# Patient Record
Sex: Female | Born: 1995 | Race: White | Hispanic: No | Marital: Single | Smoking: Never smoker
Health system: Southern US, Community
[De-identification: ages and names within clinical notes are randomized; demographics above are authoritative.]

## PROBLEM LIST (undated history)

## (undated) DIAGNOSIS — R030 Elevated blood-pressure reading, without diagnosis of hypertension: Secondary | ICD-10-CM

## (undated) DIAGNOSIS — Z87828 Personal history of other (healed) physical injury and trauma: Secondary | ICD-10-CM

## (undated) DIAGNOSIS — Z8616 Personal history of COVID-19: Secondary | ICD-10-CM

## (undated) DIAGNOSIS — W3400XA Accidental discharge from unspecified firearms or gun, initial encounter: Secondary | ICD-10-CM

## (undated) DIAGNOSIS — N201 Calculus of ureter: Secondary | ICD-10-CM

## (undated) HISTORY — PX: ADENOIDECTOMY: SHX5191

## (undated) HISTORY — PX: TONSILLECTOMY: SUR1361

---
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
## 2017-09-04 DIAGNOSIS — H9203 Otalgia, bilateral: Secondary | ICD-10-CM | POA: Diagnosis not present

## 2017-09-04 DIAGNOSIS — R0683 Snoring: Secondary | ICD-10-CM | POA: Diagnosis not present

## 2017-09-04 DIAGNOSIS — Z3009 Encounter for other general counseling and advice on contraception: Secondary | ICD-10-CM | POA: Diagnosis not present

## 2017-10-28 DIAGNOSIS — R51 Headache: Secondary | ICD-10-CM | POA: Diagnosis not present

## 2017-10-28 DIAGNOSIS — H9203 Otalgia, bilateral: Secondary | ICD-10-CM | POA: Diagnosis not present
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
## 2020-08-15 ENCOUNTER — Encounter (HOSPITAL_COMMUNITY): Payer: Self-pay | Admitting: Emergency Medicine

## 2020-08-15 ENCOUNTER — Other Ambulatory Visit: Payer: Self-pay

## 2020-08-15 ENCOUNTER — Ambulatory Visit (HOSPITAL_COMMUNITY)
Admission: EM | Admit: 2020-08-15 | Discharge: 2020-08-15 | Disposition: A | Discharge status: Home / Self Care | Payer: 59 | Attending: Urgent Care | Admitting: Urgent Care

## 2020-08-15 DIAGNOSIS — R350 Frequency of micturition: Secondary | ICD-10-CM | POA: Diagnosis present

## 2020-08-15 DIAGNOSIS — R3915 Urgency of urination: Secondary | ICD-10-CM

## 2020-08-15 DIAGNOSIS — R109 Unspecified abdominal pain: Secondary | ICD-10-CM | POA: Diagnosis not present

## 2020-08-15 LAB — POCT URINALYSIS DIPSTICK, ED / UC
Bilirubin Urine: NEGATIVE
Glucose, UA: NEGATIVE mg/dL
Ketones, ur: NEGATIVE mg/dL
Leukocytes,Ua: NEGATIVE
Nitrite: NEGATIVE
Protein, ur: NEGATIVE mg/dL
Specific Gravity, Urine: 1.01 (ref 1.005–1.030)
Urobilinogen, UA: 0.2 mg/dL (ref 0.0–1.0)
pH: 7 (ref 5.0–8.0)

## 2020-08-15 LAB — POC URINE PREG, ED: Preg Test, Ur: NEGATIVE

## 2020-08-15 MED ORDER — NAPROXEN 375 MG PO TABS: 375.0000 mg | ORAL_TABLET | Freq: Two times a day (BID) | ORAL | 0 refills | Status: DC

## 2020-08-15 NOTE — Discharge Instructions (Addendum)

## 2020-08-15 NOTE — ED Notes (Signed)
Urine in lab 
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
## 2020-08-15 NOTE — ED Triage Notes (Signed)
Patient reports right flank pain, back pain this morning.  Noticed urgency to urinate yesterday.

## 2020-08-16 LAB — URINE CULTURE: Culture: <10000 — AB

## 2020-08-16 LAB — CERVICOVAGINAL ANCILLARY ONLY
Bacterial Vaginitis (gardnerella): POSITIVE — AB
Chlamydia: NEGATIVE
Comment: NEGATIVE
Comment: NEGATIVE
Comment: NEGATIVE
Comment: NORMAL
Neisseria Gonorrhea: NEGATIVE
Trichomonas: NEGATIVE

## 2020-08-18 ENCOUNTER — Telehealth (HOSPITAL_COMMUNITY): Payer: Self-pay | Admitting: Emergency Medicine

## 2020-08-18 MED ORDER — METRONIDAZOLE 500 MG PO TABS
500.0000 mg | ORAL_TABLET | Freq: Two times a day (BID) | ORAL | 0 refills | Status: DC
Start: 2020-08-18 — End: 2021-05-28

## 2021-04-17 ENCOUNTER — Emergency Department (HOSPITAL_COMMUNITY)
Admission: EM | Admit: 2021-04-17 | Discharge: 2021-04-17 | Disposition: A | Discharge status: Home / Self Care | Payer: 59 | Attending: Emergency Medicine | Admitting: Emergency Medicine

## 2021-04-17 ENCOUNTER — Ambulatory Visit (HOSPITAL_COMMUNITY): Admission: EM | Admit: 2021-04-17 | Discharge: 2021-04-17 | Discharge status: Against Medical Advice | Payer: 59

## 2021-04-17 ENCOUNTER — Emergency Department (HOSPITAL_COMMUNITY): Payer: 59

## 2021-04-17 ENCOUNTER — Encounter (HOSPITAL_COMMUNITY): Payer: Self-pay | Admitting: Emergency Medicine

## 2021-04-17 ENCOUNTER — Other Ambulatory Visit: Payer: Self-pay

## 2021-04-17 DIAGNOSIS — N132 Hydronephrosis with renal and ureteral calculous obstruction: Secondary | ICD-10-CM | POA: Diagnosis not present

## 2021-04-17 DIAGNOSIS — N9489 Other specified conditions associated with female genital organs and menstrual cycle: Secondary | ICD-10-CM | POA: Insufficient documentation

## 2021-04-17 DIAGNOSIS — R1031 Right lower quadrant pain: Secondary | ICD-10-CM | POA: Diagnosis present

## 2021-04-17 DIAGNOSIS — N200 Calculus of kidney: Secondary | ICD-10-CM

## 2021-04-17 LAB — URINALYSIS, ROUTINE W REFLEX MICROSCOPIC
Bilirubin Urine: NEGATIVE
Glucose, UA: NEGATIVE mg/dL
Ketones, ur: 20 mg/dL — AB
Nitrite: NEGATIVE
Protein, ur: 30 mg/dL — AB
RBC / HPF: >50 RBC/hpf — ABNORMAL HIGH (ref 0–5)
Specific Gravity, Urine: 1.023 (ref 1.005–1.030)
pH: 9 — ABNORMAL HIGH (ref 5.0–8.0)

## 2021-04-17 LAB — COMPREHENSIVE METABOLIC PANEL
ALT: 16 U/L (ref 0–44)
AST: 19 U/L (ref 15–41)
Albumin: 4.2 g/dL (ref 3.5–5.0)
Alkaline Phosphatase: 65 U/L (ref 38–126)
Anion gap: 12 (ref 5–15)
BUN: 11 mg/dL (ref 6–20)
CO2: 22 mmol/L (ref 22–32)
Calcium: 9.2 mg/dL (ref 8.9–10.3)
Chloride: 105 mmol/L (ref 98–111)
Creatinine, Ser: 1.09 mg/dL — ABNORMAL HIGH (ref 0.44–1.00)
GFR, Estimated: >60 mL/min (ref >60)
Glucose, Bld: 129 mg/dL — ABNORMAL HIGH (ref 70–99)
Potassium: 3 mmol/L — ABNORMAL LOW (ref 3.5–5.1)
Sodium: 139 mmol/L (ref 135–145)
Total Bilirubin: 0.6 mg/dL (ref 0.3–1.2)
Total Protein: 7.2 g/dL (ref 6.5–8.1)

## 2021-04-17 LAB — LIPASE, BLOOD: Lipase: 26 U/L (ref 11–51)

## 2021-04-17 LAB — I-STAT BETA HCG BLOOD, ED (MC, WL, AP ONLY): I-stat hCG, quantitative: <5 m[IU]/mL (ref <5)

## 2021-04-17 LAB — CBC
HCT: 37.5 % (ref 36.0–46.0)
Hemoglobin: 12.3 g/dL (ref 12.0–15.0)
MCH: 29.4 pg (ref 26.0–34.0)
MCHC: 32.8 g/dL (ref 30.0–36.0)
MCV: 89.7 fL (ref 80.0–100.0)
Platelets: 286 10*3/uL (ref 150–400)
RBC: 4.18 MIL/uL (ref 3.87–5.11)
RDW: 13.9 % (ref 11.5–15.5)
WBC: 10.4 10*3/uL (ref 4.0–10.5)
nRBC: 0 % (ref 0.0–0.2)

## 2021-04-17 MED ORDER — LACTATED RINGERS IV BOLUS
1000.0000 mL | Freq: Once | INTRAVENOUS | Status: AC
  Administered 2021-04-17: 1000 mL via INTRAVENOUS

## 2021-04-17 MED ORDER — KETOROLAC TROMETHAMINE 15 MG/ML IJ SOLN
15.0000 mg | Freq: Once | INTRAMUSCULAR | Status: AC
  Administered 2021-04-17: 15 mg via INTRAVENOUS
  Filled 2021-04-17: qty 1

## 2021-04-17 MED ORDER — ONDANSETRON HCL 4 MG/2ML IJ SOLN
4.0000 mg | Freq: Once | INTRAMUSCULAR | Status: AC
  Administered 2021-04-17: 4 mg via INTRAVENOUS
  Filled 2021-04-17: qty 2

## 2021-04-17 MED ORDER — HYDROCODONE-ACETAMINOPHEN 5-325 MG PO TABS: 1.0000 | ORAL_TABLET | Freq: Four times a day (QID) | ORAL | 0 refills | Status: DC | PRN

## 2021-04-17 MED ORDER — TAMSULOSIN HCL 0.4 MG PO CAPS: 0.4000 mg | ORAL_CAPSULE | Freq: Every day | ORAL | 0 refills | Status: DC

## 2021-04-17 MED ORDER — MORPHINE SULFATE (PF) 4 MG/ML IV SOLN
4.0000 mg | Freq: Once | INTRAVENOUS | Status: AC
  Administered 2021-04-17: 4 mg via INTRAVENOUS
  Filled 2021-04-17: qty 1

## 2021-04-17 NOTE — ED Provider Notes (Signed)
[facility] EMERGENCY DEPARTMENT Provider Note   CSN: [id number] Arrival date & time: 04/17/21  0957     History  Chief Complaint  Patient presents with   Abdominal Pain    Janice Fisher is a 26 y.o. female.  The history is provided by the patient.  Abdominal Pain Pain location:  RLQ Pain quality: sharp, shooting and stabbing   Pain radiates to:  Does not radiate Pain severity:  Severe Onset quality:  Sudden Duration:  5 hours Timing:  Constant Progression:  Unchanged Chronicity:  New Context: awakening from sleep   Relieved by:  Nothing Exacerbated by: standing up. Ineffective treatments:  None tried Associated symptoms: anorexia, nausea and vomiting   Associated symptoms: no diarrhea, no dysuria, no fever, no vaginal bleeding and no vaginal discharge   Risk factors comment:  Did report an abnormal period this month     Home Medications Prior to Admission medications   Medication Sig Start Date End Date Taking? Authorizing Provider  HYDROcodone-acetaminophen (NORCO/VICODIN) 5-325 MG tablet Take 1 tablet by mouth every 6 (six) hours as needed for severe pain. 04/17/21  Yes Gwyneth Sprout, MD  metroNIDAZOLE (FLAGYL) 500 MG tablet Take 1 tablet (500 mg total) by mouth 2 (two) times daily. 08/18/20   Merrilee Jansky, MD  naproxen (NAPROSYN) 375 MG tablet Take 1 tablet (375 mg total) by mouth 2 (two) times daily with a meal. 08/15/20   Wallis Bamberg, PA-C  tamsulosin (FLOMAX) 0.4 MG CAPS capsule Take 1 capsule (0.4 mg total) by mouth daily after supper. 04/17/21  Yes Gwyneth Sprout, MD      Allergies    Patient has no known allergies.    Review of Systems   Review of Systems  Constitutional:  Negative for fever.  Gastrointestinal:  Positive for abdominal pain, anorexia, nausea and vomiting. Negative for diarrhea.  Genitourinary:  Negative for dysuria, vaginal bleeding and vaginal discharge.   Physical Exam Updated Vital Signs BP (!) 122/91     Pulse 90    Temp 98.6 F (37 C) (Oral)    Resp 16    SpO2 100%  Physical Exam Vitals and nursing note reviewed.  Constitutional:      General: She is not in acute distress.    Appearance: She is well-developed.     Comments: Appears uncomfortable  HENT:     Head: Normocephalic and atraumatic.  Eyes:     Pupils: Pupils are equal, round, and reactive to light.  Cardiovascular:     Rate and Rhythm: Normal rate and regular rhythm.     Heart sounds: Normal heart sounds. No murmur heard.   No friction rub.  Pulmonary:     Effort: Pulmonary effort is normal.     Breath sounds: Normal breath sounds. No wheezing or rales.  Abdominal:     General: Bowel sounds are normal. There is no distension.     Palpations: Abdomen is soft.     Tenderness: There is abdominal tenderness in the right lower quadrant. There is guarding. There is no right CVA tenderness, left CVA tenderness or rebound. Negative signs include Murphy's sign.  Musculoskeletal:        General: No tenderness. Normal range of motion.     Comments: No edema  Skin:    General: Skin is warm and dry.     Findings: No rash.  Neurological:     Mental Status: She is alert and oriented to person, place, and time.  Cranial Nerves: No cranial nerve deficit.  Psychiatric:        Behavior: Behavior normal.    ED Results / Procedures / Treatments   Labs (all labs ordered are listed, but only abnormal results are displayed) Labs Reviewed  COMPREHENSIVE METABOLIC PANEL - Abnormal; Notable for the following components:      Result Value   Potassium 3.0 (*)    Glucose, Bld 129 (*)    Creatinine, Ser 1.09 (*)    All other components within normal limits  URINALYSIS, ROUTINE W REFLEX MICROSCOPIC - Abnormal; Notable for the following components:   APPearance CLOUDY (*)    pH 9.0 (*)    Hgb urine dipstick SMALL (*)    Ketones, ur 20 (*)    Protein, ur 30 (*)    Leukocytes,Ua MODERATE (*)    RBC / HPF >50 (*)    Bacteria, UA RARE  (*)    All other components within normal limits  LIPASE, BLOOD  CBC  I-STAT BETA HCG BLOOD, ED ([facility], [facility], [facility] ONLY)    EKG None  Radiology CT Renal Stone Study  Result Date: 04/17/2021 CLINICAL DATA:  Abdominal pain and emesis since 05/30 this a.m. EXAM: CT ABDOMEN AND PELVIS WITHOUT CONTRAST TECHNIQUE: Multidetector CT imaging of the abdomen and pelvis was performed following the standard protocol without IV contrast. RADIATION DOSE REDUCTION: This exam was performed according to the departmental dose-optimization program which includes automated exposure control, adjustment of the mA and/or kV according to patient size and/or use of iterative reconstruction technique. COMPARISON:  None. FINDINGS: Lower chest: Patchy ground-glass opacities in the right middle lobe for instance on images 7 & 15/5 are consistent with an infectious or inflammatory etiology. Hepatobiliary: Unremarkable noncontrast appearance of the hepatic parenchyma. Gallbladder appears normal. No biliary ductal dilation. Pancreas: No pancreatic ductal dilation or evidence of acute inflammation. Spleen: No splenomegaly or focal splenic lesion. Adrenals/Urinary Tract: Bilateral adrenal glands appear normal. Right perinephric and periureteric stranding with hydroureteronephrosis to the level of a 5 mm stone at the ureterovesicular junction on image 78/3. Additional punctate 2 mm right lower pole nonobstructive renal calculus. Left kidney is unremarkable without hydronephrosis or renal calculus. Stomach/Bowel: No enteric contrast was administered. Stomach is unremarkable for degree of distension. No pathologic dilation of small or large bowel. Terminal ileum and appendix (coronal image 38/6) appear normal. No evidence of acute bowel inflammation. Vascular/Lymphatic: Normal caliber abdominal aorta. No pathologically enlarged abdominal or pelvic lymph nodes. Reproductive: Uterus and left adnexa appear normal. 3.4 cm right ovarian cyst. Other:  Trace pelvic free fluid, within physiologic normal limits. Musculoskeletal: No acute or significant osseous findings. IMPRESSION: 1. 5 mm obstructing stone at the right ureterovesicular junction with associated right hydroureteronephrosis. 2. Additional punctate 2 mm right lower pole nonobstructive renal calculus. 3. Patchy ground-glass opacities in the right middle lobe are consistent with an infectious or inflammatory etiology. Electronically Signed   By: Maudry Mayhew M.D.   On: 04/17/2021 12:44    Procedures Procedures    Medications Ordered in ED Medications  ketorolac (TORADOL) 15 MG/ML injection 15 mg (has no administration in time range)  lactated ringers bolus 1,000 mL (1,000 mLs Intravenous New Bag/Given 04/17/21 1043)  morphine (PF) 4 MG/ML injection 4 mg (4 mg Intravenous Given 04/17/21 1045)  ondansetron (ZOFRAN) injection 4 mg (4 mg Intravenous Given 04/17/21 1044)    ED Course/ Medical Decision Making/ A&P  Medical Decision Making Amount and/or Complexity of Data Reviewed External Data Reviewed: notes. Labs: ordered. Decision-making details documented in ED Course. Radiology: ordered and independent interpretation performed. Decision-making details documented in ED Course. ECG/medicine tests: ordered and independent interpretation performed. Decision-making details documented in ED Course.  Risk Prescription drug management.   Healthy 26 year old female presenting today with symptoms of abdominal pain that started suddenly.  Concern for renal stone, appendicitis, ovarian pathology, ectopic pregnancy.  Patient denies any vaginal discharge or bleeding.  Moderate pain with palpation on exam.  She denies any urinary symptoms and lower suspicion for UTI.  Patient given pain and nausea control.  Labs and imaging are pending.  1:36 PM I independently interpreted patient's labs and today she has a normal CBC, CMP with hypokalemia of 3.0 but stable renal  function, UA is contaminated but does have a large amount of blood but only few white blood cells and bacteria and again it is a contaminated sample.  hCG is negative and lipase is within normal limits.  Patient did receive IV pain medication.  CT renal is pending  1:36 PM I independently reviewed and interpreted the abdominal and pelvis CT today and patient appears to have right-sided hydronephrosis and renal stone.  Radiology reports 5 mm obstructive right UVJ stone with hydroureteronephrosis.  Also they report a patchy groundglass opacity in the right middle lobe concerning for infectious or inflammatory etiology.  Speaking with patient she reports she had COVID about 1 month ago and she has had some ongoing coughing but reports things are improving.  Feel this is most likely aftereffects of COVID and does not need further work-up at this time.  After IV narcotic medication patient's pain has improved but she reports is starting to come back a little bit.  She was given Toradol.  Plan will be for discharge home as patient does not meet admission criteria at this time.  She was given pain control and Flomax to take at home.  Findings were discussed with she and her sister who are at bedside.  Questions were answered.  Patient is comfortable with this plan.        Final Clinical Impression(s) / ED Diagnoses Final diagnoses:  Right kidney stone    Rx / DC Orders ED Discharge Orders          Ordered    HYDROcodone-acetaminophen (NORCO/VICODIN) 5-325 MG tablet  Every 6 hours PRN        04/17/21 1335    tamsulosin (FLOMAX) 0.4 MG CAPS capsule  Daily after supper        04/17/21 1335              Gwyneth Sprout, MD 04/17/21 1336

## 2021-04-17 NOTE — Discharge Instructions (Addendum)
You have a large kidney stone on the right side.  If you start having fever, vomiting, cannot hold anything down or the pain is not controlled with the medications you can return to the emergency room.  In addition to the prescription pain medication if Tylenol and ibuprofen keep the pain under control you can always stick with those medications as well.  I expect that your stone should pass soon because it is very close to passing currently.

## 2021-04-17 NOTE — ED Notes (Signed)
Pt verbalizes understanding of discharge instructions. Opportunity for questions and answers were provided. Pt discharged from the ED.   ?

## 2021-04-17 NOTE — ED Triage Notes (Signed)
Patient complains of abdominal pain and emesis that started at 0530 this morning, reports abdominal pain last week that she thought was the beginning of her menstrual cycle but states she did not have any vaginal bleeding. Patient alert, oriented, and in no apparent distress at this time.

## 2021-04-25 ENCOUNTER — Other Ambulatory Visit: Payer: Self-pay

## 2021-04-25 ENCOUNTER — Emergency Department (HOSPITAL_BASED_OUTPATIENT_CLINIC_OR_DEPARTMENT_OTHER): Payer: 59

## 2021-04-25 ENCOUNTER — Other Ambulatory Visit (HOSPITAL_BASED_OUTPATIENT_CLINIC_OR_DEPARTMENT_OTHER): Payer: Self-pay

## 2021-04-25 ENCOUNTER — Emergency Department (HOSPITAL_BASED_OUTPATIENT_CLINIC_OR_DEPARTMENT_OTHER)
Admission: EM | Admit: 2021-04-25 | Discharge: 2021-04-25 | Disposition: A | Discharge status: Home / Self Care | Payer: 59 | Attending: Emergency Medicine | Admitting: Emergency Medicine

## 2021-04-25 DIAGNOSIS — N200 Calculus of kidney: Secondary | ICD-10-CM | POA: Diagnosis present

## 2021-04-25 DIAGNOSIS — N132 Hydronephrosis with renal and ureteral calculous obstruction: Secondary | ICD-10-CM | POA: Diagnosis not present

## 2021-04-25 DIAGNOSIS — N9489 Other specified conditions associated with female genital organs and menstrual cycle: Secondary | ICD-10-CM | POA: Insufficient documentation

## 2021-04-25 DIAGNOSIS — R109 Unspecified abdominal pain: Secondary | ICD-10-CM

## 2021-04-25 LAB — CBC
HCT: 37.7 % (ref 36.0–46.0)
Hemoglobin: 11.7 g/dL — ABNORMAL LOW (ref 12.0–15.0)
MCH: 27.9 pg (ref 26.0–34.0)
MCHC: 31 g/dL (ref 30.0–36.0)
MCV: 89.8 fL (ref 80.0–100.0)
Platelets: 269 10*3/uL (ref 150–400)
RBC: 4.2 MIL/uL (ref 3.87–5.11)
RDW: 14 % (ref 11.5–15.5)
WBC: 12.1 10*3/uL — ABNORMAL HIGH (ref 4.0–10.5)
nRBC: 0 % (ref 0.0–0.2)

## 2021-04-25 LAB — BASIC METABOLIC PANEL
Anion gap: 9 (ref 5–15)
BUN: 15 mg/dL (ref 6–20)
CO2: 24 mmol/L (ref 22–32)
Calcium: 9.5 mg/dL (ref 8.9–10.3)
Chloride: 106 mmol/L (ref 98–111)
Creatinine, Ser: 0.91 mg/dL (ref 0.44–1.00)
GFR, Estimated: >60 mL/min (ref >60)
Glucose, Bld: 89 mg/dL (ref 70–99)
Potassium: 3.6 mmol/L (ref 3.5–5.1)
Sodium: 139 mmol/L (ref 135–145)

## 2021-04-25 LAB — URINALYSIS, ROUTINE W REFLEX MICROSCOPIC
Bilirubin Urine: NEGATIVE
Glucose, UA: NEGATIVE mg/dL
Ketones, ur: NEGATIVE mg/dL
Leukocytes,Ua: NEGATIVE
Nitrite: NEGATIVE
Protein, ur: 30 mg/dL — AB
RBC / HPF: >50 RBC/hpf — ABNORMAL HIGH (ref 0–5)
Specific Gravity, Urine: 1.014 (ref 1.005–1.030)
pH: 7.5 (ref 5.0–8.0)

## 2021-04-25 LAB — HCG, SERUM, QUALITATIVE: Preg, Serum: NEGATIVE

## 2021-04-25 MED ORDER — OXYCODONE-ACETAMINOPHEN 5-325 MG PO TABS
1.0000 | ORAL_TABLET | Freq: Four times a day (QID) | ORAL | 0 refills | Status: DC | PRN
  Filled 2021-04-25: qty 15, 4d supply, fill #0

## 2021-04-25 MED ORDER — CEPHALEXIN 250 MG PO CAPS
250.0000 mg | ORAL_CAPSULE | Freq: Once | ORAL | Status: AC
  Administered 2021-04-25: 250 mg via ORAL
  Filled 2021-04-25: qty 1

## 2021-04-25 MED ORDER — CEPHALEXIN 500 MG PO CAPS
500.0000 mg | ORAL_CAPSULE | Freq: Four times a day (QID) | ORAL | 0 refills | Status: DC
  Filled 2021-04-25: qty 20, 5d supply, fill #0

## 2021-04-25 MED ORDER — SODIUM CHLORIDE 0.9 % IV BOLUS
1000.0000 mL | Freq: Once | INTRAVENOUS | Status: AC
  Administered 2021-04-25: 1000 mL via INTRAVENOUS

## 2021-04-25 MED ORDER — KETOROLAC TROMETHAMINE 15 MG/ML IJ SOLN
15.0000 mg | Freq: Once | INTRAMUSCULAR | Status: AC
  Administered 2021-04-25: 15 mg via INTRAVENOUS
  Filled 2021-04-25: qty 1

## 2021-04-25 MED ORDER — MORPHINE SULFATE (PF) 2 MG/ML IV SOLN
2.0000 mg | Freq: Once | INTRAVENOUS | Status: AC
  Administered 2021-04-25: 2 mg via INTRAVENOUS
  Filled 2021-04-25: qty 1

## 2021-04-25 NOTE — ED Provider Notes (Signed)
?[place] EMERGENCY DEPT ?Provider Note ? ? ?CSN: [id number] ?Arrival date & time: 04/25/21  1105 ? ?  ? ?History ? ?Chief Complaint  ?Patient presents with  ? Flank Pain  ? ? ?Janice Fisher is a 25 y.o. female. ? ?HPI ?26 yo female complaining of right flank pain with kidney stone.  She was diagnosed with 5 mm stone at the uvj on 2/21.  Pain was being controlled with otc meds.  Patient has apppointment with urology on Monday.  Pain worsened last night.  Increased frequecny of urination ?LMP during past 3 weeks.  Denies sexual activity currently-  ?No vomiting today. One episode of emesis yesterday. ? ? ?  ? ?Home Medications ?Prior to Admission medications   ?Medication Sig Start Date End Date Taking? Authorizing Provider  ?cephALEXin (KEFLEX) 500 MG capsule Take 1 capsule (500 mg total) by mouth 4 (four) times daily. 04/25/21  Yes Pattricia Boss, MD  ?oxyCODONE-acetaminophen (PERCOCET/ROXICET) 5-325 MG tablet Take 1 tablet by mouth every 6 (six) hours as needed for severe pain. 04/25/21  Yes Pattricia Boss, MD  ?HYDROcodone-acetaminophen (NORCO/VICODIN) 5-325 MG tablet Take 1 tablet by mouth every 6 (six) hours as needed for severe pain. 04/17/21   Blanchie Dessert, MD  ?metroNIDAZOLE (FLAGYL) 500 MG tablet Take 1 tablet (500 mg total) by mouth 2 (two) times daily. 08/18/20   Chase Picket, MD  ?naproxen (NAPROSYN) 375 MG tablet Take 1 tablet (375 mg total) by mouth 2 (two) times daily with a meal. 08/15/20   Jaynee Eagles, PA-C  ?tamsulosin (FLOMAX) 0.4 MG CAPS capsule Take 1 capsule (0.4 mg total) by mouth daily after supper. 04/17/21   Blanchie Dessert, MD  ?   ? ?Allergies    ?Patient has no known allergies.   ? ?Review of Systems   ?Review of Systems  ?All other systems reviewed and are negative. ? ?Physical Exam ?Updated Vital Signs ?BP 125/78   Pulse 83   Temp 98.4 ?F (36.9 ?C) (Oral)   Resp 15   Ht 1.524 m (5')   Wt 81.6 kg   LMP 04/18/2021   SpO2 100%   BMI 35.15 kg/m?  ?Physical  Exam ?Vitals and nursing note reviewed.  ?Constitutional:   ?   Appearance: Normal appearance.  ?HENT:  ?   Head: Normocephalic and atraumatic.  ?   Right Ear: External ear normal.  ?   Left Ear: External ear normal.  ?   Nose: Congestion present.  ?Cardiovascular:  ?   Rate and Rhythm: Normal rate and regular rhythm.  ?Pulmonary:  ?   Effort: Pulmonary effort is normal.  ?Abdominal:  ?   Comments: Mild right periumbilical pain  ?Skin: ?   General: Skin is warm.  ?   Capillary Refill: Capillary refill takes less than 2 seconds.  ?Neurological:  ?   General: No focal deficit present.  ?   Mental Status: She is alert.  ?Psychiatric:     ?   Mood and Affect: Mood normal.  ? ? ?ED Results / Procedures / Treatments   ?Labs ?(all labs ordered are listed, but only abnormal results are displayed) ?Labs Reviewed  ?URINALYSIS, ROUTINE W REFLEX MICROSCOPIC - Abnormal; Notable for the following components:  ?    Result Value  ? Hgb urine dipstick LARGE (*)   ? Protein, ur 30 (*)   ? RBC / HPF >50 (*)   ? Bacteria, UA RARE (*)   ? All other components within normal limits  ?CBC -  Abnormal; Notable for the following components:  ? WBC 12.1 (*)   ? Hemoglobin 11.7 (*)   ? All other components within normal limits  ?URINE CULTURE  ?BASIC METABOLIC PANEL  ?HCG, SERUM, QUALITATIVE  ? ? ?EKG ?None ? ?Radiology ?US RENAL ? ?Result Date: 04/25/2021 ?CLINICAL DATA:  Kidney stone follow-up. EXAM: RENAL / URINARY TRACT ULTRASOUND COMPLETE COMPARISON:  CT abdomen pelvis dated April 17, 2021. FINDINGS: Right Kidney: Renal measurements: 11.9 x 5.4 x 6.3 cm = volume: 214 mL. Echogenicity within normal limits. Unchanged mild hydronephrosis. No mass visualized. Left Kidney: Renal measurements: 12.2 x 5.5 x 5.0 cm = volume: 175 mL. Echogenicity within normal limits. No mass or hydronephrosis visualized. Bladder: Appears normal for degree of bladder distention. Other: None. IMPRESSION: 1. Unchanged mild right hydronephrosis. Electronically Signed    By: Titus Dubin M.D.   On: 04/25/2021 12:19   ? ?Procedures ?Procedures  ? ? ?Medications Ordered in ED ?Medications  ?sodium chloride 0.9 % bolus 1,000 mL (1,000 mLs Intravenous New Bag/Given 04/25/21 1141)  ?ketorolac (TORADOL) 15 MG/ML injection 15 mg (15 mg Intravenous Given 04/25/21 1142)  ?morphine (PF) 2 MG/ML injection 2 mg (2 mg Intravenous Given 04/25/21 1301)  ?cephALEXin (KEFLEX) capsule 250 mg (250 mg Oral Given 04/25/21 1326)  ? ? ?ED Course/ Medical Decision Making/ A&P ?Clinical Course as of 04/25/21 1403  ?Wed Apr 25, 2021  ?1240 Ultrasound reviewed and unchanged mild right hydro with nephrosis consistent with her previous lead diagnosis kidney stone ?Urinalysis reviewed and greater than 50 red blood cells with 0-5 white blood cells and rare bacteria, suspect that is contaminant, however given situation and ongoing flank pain and increased frequency of urination, will culture and start Keflex. [DR]  ?1241 CBC reviewed interpreted with mild leukocytosis and mild anemia with hemoglobin 11.7 ?Bement reviewed and interpreted with normal electrolytes, BUN/creatinine [DR]  ?1244 Patient reports continued severe flank pain.  No improvement with Toradol will give additional dose of morphine [DR]  ?  ?Clinical Course User Index ?[DR] Pattricia Boss, MD  ? ?                        ?Medical Decision Making ?26 year old female with previously diagnosed right UVJ stone presents today with ongoing pain.  Patient received Toradol and morphine here in the ED with improved pain.  She has an appointment to follow-up with urology.  She has a few bacteria in her urine.  Urine will be cultured and she will be started on Keflex. ?Discussed return precautions need for follow-up and patient voices understanding. ? ?Amount and/or Complexity of Data Reviewed ?Labs: ordered. ?Radiology: ordered. ? ?Risk ?Prescription drug management. ? ? ? ? ? ? ? ? ? ?Final Clinical Impression(s) / ED Diagnoses ?Final diagnoses:  ?Kidney stone   ?Right flank pain  ? ? ?Rx / DC Orders ?ED Discharge Orders   ? ?      Ordered  ?  oxyCODONE-acetaminophen (PERCOCET/ROXICET) 5-325 MG tablet  Every 6 hours PRN       ? 04/25/21 1402  ?  cephALEXin (KEFLEX) 500 MG capsule  4 times daily       ? 04/25/21 1403  ? ?  ?  ? ?  ? ? ?  ?Pattricia Boss, MD ?04/25/21 1403 ? ?

## 2021-04-25 NOTE — Discharge Instructions (Signed)
You continue to have obstruction of your right kidney.  You are being started on antibiotics. ?Please keep your appointment for follow-up with the urologist ?Please call them to see if they can get you in any sooner. ?

## 2021-04-25 NOTE — ED Triage Notes (Signed)
Right sided flank pain x 1 week. Dx with kidney stone last week. Pain worse today.  ?

## 2021-04-27 LAB — URINE CULTURE

## 2021-05-15 ENCOUNTER — Ambulatory Visit: Payer: 59 | Admitting: Allergy & Immunology

## 2021-05-15 ENCOUNTER — Other Ambulatory Visit: Payer: Self-pay | Admitting: Urology

## 2021-05-28 ENCOUNTER — Encounter (HOSPITAL_BASED_OUTPATIENT_CLINIC_OR_DEPARTMENT_OTHER): Payer: Self-pay | Admitting: Urology

## 2021-05-28 ENCOUNTER — Other Ambulatory Visit: Payer: Self-pay

## 2021-05-28 NOTE — Progress Notes (Signed)
Spoke w/ via phone for pre-op interview--- pt ?Lab needs dos----   urine preg            ?Lab results------ no ?COVID test -----patient states asymptomatic no test needed ?Arrive at ------- 1000 on 06-04-2021 ?NPO after MN NO Solid Food.  Clear liquids from MN until--- 0900 ?Med rec completed ?Medications to take morning of surgery ----- none ?Diabetic medication ----- n/a ?Patient instructed no nail polish to be worn day of surgery ?Patient instructed to bring photo id and insurance card day of surgery ?Patient aware to have Driver (ride ) / caregiver  for 24 hours after surgery --father, antoine ?Patient Special Instructions ----- n/a ?Pre-Op special Istructions ----- n/a ?Patient verbalized understanding of instructions that were given at this phone interview. ?Patient denies shortness of breath, chest pain, fever, cough at this phone interview.  ?

## 2021-06-04 ENCOUNTER — Ambulatory Visit (HOSPITAL_BASED_OUTPATIENT_CLINIC_OR_DEPARTMENT_OTHER): Payer: 59 | Admitting: Anesthesiology

## 2021-06-04 ENCOUNTER — Encounter (HOSPITAL_BASED_OUTPATIENT_CLINIC_OR_DEPARTMENT_OTHER)
Admission: RE | Disposition: A | Discharge status: Home / Self Care | Payer: Self-pay | Source: Home / Self Care | Attending: Urology

## 2021-06-04 ENCOUNTER — Encounter (HOSPITAL_BASED_OUTPATIENT_CLINIC_OR_DEPARTMENT_OTHER): Payer: Self-pay | Admitting: Urology

## 2021-06-04 ENCOUNTER — Ambulatory Visit (HOSPITAL_BASED_OUTPATIENT_CLINIC_OR_DEPARTMENT_OTHER)
Admission: RE | Admit: 2021-06-04 | Discharge: 2021-06-04 | Disposition: A | Discharge status: Home / Self Care | Payer: 59 | Attending: Urology | Admitting: Urology

## 2021-06-04 DIAGNOSIS — N132 Hydronephrosis with renal and ureteral calculous obstruction: Secondary | ICD-10-CM | POA: Insufficient documentation

## 2021-06-04 DIAGNOSIS — N202 Calculus of kidney with calculus of ureter: Secondary | ICD-10-CM

## 2021-06-04 DIAGNOSIS — N200 Calculus of kidney: Secondary | ICD-10-CM

## 2021-06-04 HISTORY — DX: Calculus of ureter: N20.1

## 2021-06-04 HISTORY — DX: Elevated blood-pressure reading, without diagnosis of hypertension: R03.0

## 2021-06-04 HISTORY — PX: CYSTOSCOPY/URETEROSCOPY/HOLMIUM LASER/STENT PLACEMENT: SHX6546

## 2021-06-04 HISTORY — DX: Personal history of COVID-19: Z86.16

## 2021-06-04 HISTORY — DX: Personal history of other (healed) physical injury and trauma: Z87.828

## 2021-06-04 LAB — POCT PREGNANCY, URINE: Preg Test, Ur: NEGATIVE

## 2021-06-04 SURGERY — CYSTOSCOPY/URETEROSCOPY/HOLMIUM LASER/STENT PLACEMENT
Anesthesia: General | Site: Ureter | Laterality: Right

## 2021-06-04 MED ORDER — DEXAMETHASONE SODIUM PHOSPHATE 4 MG/ML IJ SOLN
INTRAMUSCULAR | Status: DC | PRN
  Administered 2021-06-04: 10 mg via INTRAVENOUS

## 2021-06-04 MED ORDER — PROPOFOL 10 MG/ML IV BOLUS
INTRAVENOUS | Status: DC | PRN
  Administered 2021-06-04: 200 mg via INTRAVENOUS

## 2021-06-04 MED ORDER — DOCUSATE SODIUM 100 MG PO CAPS: 100.0000 mg | ORAL_CAPSULE | Freq: Every day | ORAL | 0 refills | Status: DC | PRN

## 2021-06-04 MED ORDER — KETOROLAC TROMETHAMINE 30 MG/ML IJ SOLN
INTRAMUSCULAR | Status: DC | PRN
  Administered 2021-06-04: 30 mg via INTRAVENOUS

## 2021-06-04 MED ORDER — MIDAZOLAM HCL 2 MG/2ML IJ SOLN
INTRAMUSCULAR | Status: AC
  Filled 2021-06-04: qty 2

## 2021-06-04 MED ORDER — IOHEXOL 300 MG/ML  SOLN
INTRAMUSCULAR | Status: DC | PRN
  Administered 2021-06-04: 10 mL via URETHRAL

## 2021-06-04 MED ORDER — SODIUM CHLORIDE 0.9 % IR SOLN
Status: DC | PRN
  Administered 2021-06-04: 3000 mL

## 2021-06-04 MED ORDER — CEFAZOLIN SODIUM-DEXTROSE 2-4 GM/100ML-% IV SOLN
2.0000 g | Freq: Once | INTRAVENOUS | Status: AC
  Administered 2021-06-04: 2 g via INTRAVENOUS

## 2021-06-04 MED ORDER — FENTANYL CITRATE (PF) 100 MCG/2ML IJ SOLN
25.0000 ug | INTRAMUSCULAR | Status: DC | PRN
  Administered 2021-06-04 (×3): 25 ug via INTRAVENOUS

## 2021-06-04 MED ORDER — ACETAMINOPHEN 500 MG PO TABS
1000.0000 mg | ORAL_TABLET | Freq: Once | ORAL | Status: AC
  Administered 2021-06-04: 1000 mg via ORAL

## 2021-06-04 MED ORDER — 0.9 % SODIUM CHLORIDE (POUR BTL) OPTIME
TOPICAL | Status: DC | PRN
  Administered 2021-06-04: 500 mL

## 2021-06-04 MED ORDER — ONDANSETRON HCL 4 MG/2ML IJ SOLN
INTRAMUSCULAR | Status: DC | PRN
  Administered 2021-06-04: 4 mg via INTRAVENOUS

## 2021-06-04 MED ORDER — LACTATED RINGERS IV SOLN: INTRAVENOUS | Status: DC

## 2021-06-04 MED ORDER — MIDAZOLAM HCL 5 MG/5ML IJ SOLN
INTRAMUSCULAR | Status: DC | PRN
  Administered 2021-06-04: 2 mg via INTRAVENOUS

## 2021-06-04 MED ORDER — PROPOFOL 10 MG/ML IV BOLUS
INTRAVENOUS | Status: AC
  Filled 2021-06-04: qty 20

## 2021-06-04 MED ORDER — OXYCODONE-ACETAMINOPHEN 5-325 MG PO TABS: 1.0000 | ORAL_TABLET | ORAL | 0 refills | Status: DC | PRN

## 2021-06-04 MED ORDER — CEFAZOLIN SODIUM-DEXTROSE 2-4 GM/100ML-% IV SOLN
INTRAVENOUS | Status: AC
  Filled 2021-06-04: qty 100

## 2021-06-04 MED ORDER — FENTANYL CITRATE (PF) 100 MCG/2ML IJ SOLN
INTRAMUSCULAR | Status: DC | PRN
  Administered 2021-06-04 (×4): 25 ug via INTRAVENOUS

## 2021-06-04 MED ORDER — FENTANYL CITRATE (PF) 100 MCG/2ML IJ SOLN
INTRAMUSCULAR | Status: AC
  Filled 2021-06-04: qty 2

## 2021-06-04 MED ORDER — LIDOCAINE HCL (CARDIAC) PF 100 MG/5ML IV SOSY
PREFILLED_SYRINGE | INTRAVENOUS | Status: DC | PRN
  Administered 2021-06-04: 60 mg via INTRAVENOUS

## 2021-06-04 MED ORDER — FENTANYL CITRATE (PF) 100 MCG/2ML IJ SOLN
INTRAMUSCULAR | Status: AC
Start: 2021-06-04 — End: ?
  Filled 2021-06-04: qty 2

## 2021-06-04 MED ORDER — DEXAMETHASONE SODIUM PHOSPHATE 10 MG/ML IJ SOLN
INTRAMUSCULAR | Status: AC
  Filled 2021-06-04: qty 1

## 2021-06-04 MED ORDER — ACETAMINOPHEN 500 MG PO TABS
ORAL_TABLET | ORAL | Status: AC
  Filled 2021-06-04: qty 2

## 2021-06-04 MED ORDER — LIDOCAINE HCL (PF) 2 % IJ SOLN
INTRAMUSCULAR | Status: AC
  Filled 2021-06-04: qty 5

## 2021-06-04 MED ORDER — KETOROLAC TROMETHAMINE 30 MG/ML IJ SOLN
INTRAMUSCULAR | Status: AC
  Filled 2021-06-04: qty 1

## 2021-06-04 MED ORDER — CEPHALEXIN 500 MG PO CAPS
500.0000 mg | ORAL_CAPSULE | Freq: Two times a day (BID) | ORAL | 0 refills | Status: AC
Start: 2021-06-04 — End: 2021-06-07

## 2021-06-04 MED ORDER — ONDANSETRON HCL 4 MG/2ML IJ SOLN
INTRAMUSCULAR | Status: AC
  Filled 2021-06-04: qty 2

## 2021-06-04 SURGICAL SUPPLY — 27 items
APL SKNCLS STERI-STRIP NONHPOA (GAUZE/BANDAGES/DRESSINGS) ×1
BAG DRAIN URO-CYSTO SKYTR STRL (DRAIN) ×2 IMPLANT
BAG DRN UROCATH (DRAIN) ×1
BASKET ZERO TIP NITINOL 2.4FR (BASKET) ×1 IMPLANT
BENZOIN TINCTURE PRP APPL 2/3 (GAUZE/BANDAGES/DRESSINGS) ×1 IMPLANT
BSKT STON RTRVL ZERO TP 2.4FR (BASKET) ×1
CATH URET 5FR 28IN OPEN ENDED (CATHETERS) ×2 IMPLANT
CLOTH BEACON ORANGE TIMEOUT ST (SAFETY) ×2 IMPLANT
COVER DOME SNAP 22 D (MISCELLANEOUS) ×1 IMPLANT
DRSG TEGADERM 2-3/8X2-3/4 SM (GAUZE/BANDAGES/DRESSINGS) ×1 IMPLANT
GLOVE BIO SURGEON STRL SZ7 (GLOVE) ×2 IMPLANT
GLOVE SURG UNDER POLY LF SZ6.5 (GLOVE) ×2 IMPLANT
GOWN STRL REUS W/ TWL LRG LVL3 (GOWN DISPOSABLE) IMPLANT
GOWN STRL REUS W/TWL LRG LVL3 (GOWN DISPOSABLE) ×4 IMPLANT
GUIDEWIRE STR DUAL SENSOR (WIRE) ×2 IMPLANT
GUIDEWIRE ZIPWRE .038 STRAIGHT (WIRE) ×1 IMPLANT
IV NS IRRIG 3000ML ARTHROMATIC (IV SOLUTION) ×2 IMPLANT
KIT TURNOVER CYSTO (KITS) ×2 IMPLANT
MANIFOLD NEPTUNE II (INSTRUMENTS) ×2 IMPLANT
NS IRRIG 500ML POUR BTL (IV SOLUTION) ×2 IMPLANT
PACK CYSTO (CUSTOM PROCEDURE TRAY) ×2 IMPLANT
STENT URET 6FRX24 CONTOUR (STENTS) ×1 IMPLANT
SYR 10ML LL (SYRINGE) ×2 IMPLANT
TRACTIP FLEXIVA PULS ID 200XHI (Laser) IMPLANT
TRACTIP FLEXIVA PULSE ID 200 (Laser) ×2
TUBE CONNECTING 12X1/4 (SUCTIONS) ×2 IMPLANT
TUBING UROLOGY SET (TUBING) ×2 IMPLANT

## 2021-06-04 NOTE — Op Note (Signed)
Operative Note ? ?Preoperative diagnosis:  ?1.  Right ureteral and renal stone ? ?Postoperative diagnosis: ?1.  Right ureteral and renal stone ? ?Procedure(s): ?1.  Cystoscopy ?2. Right ureteroscopy with laser lithotripsy and basket extraction of stones ?3. Right retrograde pyelogram ?4. Right ureteral stent placement ?5. Fluoroscopy with intraoperative interpretation ? ?Surgeon: Jettie Pagan, MD ? ?Assistants:  None ? ?Anesthesia:  General ? ?Complications:  None ? ?EBL:  Minimal ? ?Specimens: ?1. None ? ?Drains/Catheters: ?1.  6Fr x 24cm ureteral stent WITH a tether string ? ?Intraoperative findings:   ?Cystoscopy demonstrated no suspicious bladder lesions. ?Right ureteroscopy demonstrated no stones within her right ureter. She did have a 45mm stone in the lower pole of the right kidney that was dusted into sand pieces and too small to basket extract. ?Successful right ureteral stent placement. ? ?Indication:  Janice Fisher is a 26 y.o. Fisher with a history of a right ureteral and renal stone here for definitive stone treatment. ? ?Description of procedure: ?After informed consent was obtained from the patient, the patient was identified and taken to the operating room and placed in the supine position.  General anesthesia was administered as well as perioperative IV antibiotics.  At the beginning of the case, a time-out was performed to properly identify the patient, the surgery to be performed, and the surgical site.  Sequential compression devices were applied to the lower extremities at the beginning of the case for DVT prophylaxis.  The patient was then placed in the dorsal lithotomy supine position, prepped and draped in sterile fashion. ? ?We then passed the 21-French rigid cystoscope through the urethra and into the bladder under vision without any difficulty, noting a normal urethra.  A systematic evaluation of the bladder revealed no evidence of any suspicious bladder lesions.  Ureteral orifices were in  normal position.   ? ?Under cystoscopic and flouroscopic guidance, we cannulated the right ureteral orifice with a 5-French open-ended ureteral catheter and a gentle retrograde pyelogram was performed, revealing a normal caliber ureter without any filling defects. There was no hydronephrosis of the collecting system. The ureteral catheter and cystoscope were removed, leaving the safety wire in place.  ? ?A semi-rigid ureteroscope was passed alongside the wire up the distal ureter which appeared normal. A second 0.038 sensor wire was passed under direct vision. Over this wire, a flexible ureteroscope was passed into the kidney under fluoroscopic guidance. The collecting system was inspected. The calculus was identified at the right lower pole measuring about 38mm. Using the 200 micron holmium laser fiber, the stone was dusted completely. The fragments were too small to basket extract. With the ureteroscope in the kidney, a gentle pyelogram was performed to delineate the calyceal system and we evaluated the calyces systematically. We encountered no further stones. The rest of the stone fragments were very tiny and these were  irrigated away gently. The calyces were re-inspected and there were no significant stone fragment residual.  ? ?We then withdrew the ureteroscope back down the ureter, noting no evidence of any stones along the course of the ureter.  Prior to removing the ureteroscope, we did pass the Glidewire back up to the ureter to the renal pelvis.  Once the ureteroscope was removed, we then used the Glidewire under fluoroscopic guidance and passed up a 6-French x 24 cm double-pigtail ureteral stent up the ureter, making sure that the proximal and distal ends coiled within the kidney and bladder respectively. ? ?Note that we left a long tether string  attached to the distal end of the ureteral stent and it exited the urethral meatus and was secured to the inner thigh with a tegaderm adhesive.  The cystoscope  was then advanced back into the bladder under vision.  We were able to see the distal stent coiling nicely within the bladder.  The bladder was then emptied with irrigation solution.  The cystoscope was then removed.   ? ?The patient tolerated the procedure well and there was no complication. Patient was awoken from anesthesia and taken to the recovery room in stable condition. I was present and scrubbed for the entirety of the case. ? ?Plan:  Patient will be discharged home.  Follow up with me in one month with renal ultrasound. ? ?Janice R. Legacie Dillingham MD ?[facility]  ?Pager: [phone] ? ?

## 2021-06-04 NOTE — Anesthesia Procedure Notes (Signed)
Procedure Name: LMA Insertion ?Date/Time: 06/04/2021 12:21 PM ?Performed by: Justice Rocher, CRNA ?Pre-anesthesia Checklist: Patient identified, Emergency Drugs available, Suction available, Patient being monitored and Timeout performed ?Patient Re-evaluated:Patient Re-evaluated prior to induction ?Oxygen Delivery Method: Circle system utilized ?Preoxygenation: Pre-oxygenation with 100% oxygen ?Induction Type: IV induction ?Ventilation: Mask ventilation without difficulty ?LMA: LMA inserted ?LMA Size: 4.0 ?Number of attempts: 1 ?Airway Equipment and Method: Bite block ?Placement Confirmation: positive ETCO2, breath sounds checked- equal and bilateral and CO2 detector ?Tube secured with: Tape ?Dental Injury: Teeth and Oropharynx as per pre-operative assessment  ? ? ? ? ?

## 2021-06-04 NOTE — Transfer of Care (Signed)
Immediate Anesthesia Transfer of Care Note ? ?Patient: Janice Fisher ? ?Procedure(s) Performed: Procedure(s) (LRB): ?CYSTOSCOPY/ RETROGRADE/URETEROSCOPY/HOLMIUM LASER/STENT PLACEMENT (Right) ? ?Patient Location: PACU ? ?Anesthesia Type: General ? ?Level of Consciousness: awake, sedated, patient cooperative and responds to stimulation ? ?Airway & Oxygen Therapy: Patient Spontanous Breathing and Patient connected to [place] 02  ? ?Post-op Assessment: Report given to PACU RN, Post -op Vital signs reviewed and stable and Patient moving all extremities ? ?Post vital signs: Reviewed and stable ? ?Complications: No apparent anesthesia complications ?

## 2021-06-04 NOTE — Anesthesia Preprocedure Evaluation (Addendum)
Anesthesia Evaluation  ?Patient identified by MRN, date of birth, ID band ?Patient awake ? ? ? ?Reviewed: ?Allergy & Precautions, NPO status , Patient's Chart, lab work & pertinent test results ? ?Airway ?Mallampati: III ? ?TM Distance: >3 FB ?Neck ROM: Full ? ? ? Dental ?no notable dental hx. ?(+) Teeth Intact, Dental Advisory Given ?  ?Pulmonary ?neg pulmonary ROS,  ?  ?Pulmonary exam normal ?breath sounds clear to auscultation ? ? ? ? ? ? Cardiovascular ?negative cardio ROS ?Normal cardiovascular exam ?Rhythm:Regular Rate:Normal ? ? ?  ?Neuro/Psych ?negative neurological ROS ? negative psych ROS  ? GI/Hepatic ?negative GI ROS, Neg liver ROS,   ?Endo/Other  ?negative endocrine ROSObese BMI 36 ? Renal/GU ?negative Renal ROS  ?negative genitourinary ?  ?Musculoskeletal ?negative musculoskeletal ROS ?(+)  ? Abdominal ?  ?Peds ? Hematology ?negative hematology ROS ?(+)   ?Anesthesia Other Findings ?Right ureteral stone ? Reproductive/Obstetrics ? ?  ? ? ? ? ? ? ? ? ? ? ? ? ? ?  ?  ? ? ? ? ? ? ? ?Anesthesia Physical ?Anesthesia Plan ? ?ASA: 2 ? ?Anesthesia Plan: General  ? ?Post-op Pain Management: Tylenol PO (pre-op)* and Toradol IV (intra-op)*  ? ?Induction: Intravenous ? ?PONV Risk Score and Plan: 3 and Ondansetron, Dexamethasone and Midazolam ? ?Airway Management Planned: LMA ? ?Additional Equipment:  ? ?Intra-op Plan:  ? ?Post-operative Plan: Extubation in OR ? ?Informed Consent: I have reviewed the patients History and Physical, chart, labs and discussed the procedure including the risks, benefits and alternatives for the proposed anesthesia with the patient or authorized representative who has indicated his/her understanding and acceptance.  ? ? ? ?Dental advisory given ? ?Plan Discussed with: CRNA ? ?Anesthesia Plan Comments:   ? ? ? ? ? ? ?Anesthesia Quick Evaluation ? ?

## 2021-06-04 NOTE — H&P (Signed)
Office Visit Report     04/30/2021  ? ?-------------------------------------------------------------------------------- ?  ?Raynelle Bring  ?MRN: [id number]  ?DOB: 1995/12/16, 26 year old Female  ?SSN:   ? PRIMARY CARE:    ?REFERRING:  Blanchie Dessert, MD  ?PROVIDER:  Rexene Alberts, M.D.  ?LOCATION:  [facility] [phone]  ?  ? ?-------------------------------------------------------------------------------- ?  ?CC/HPI: Laurenda Stepler is a 26 year old female seen in consultation today for a right ureteral stone.  ? ?#1. Right ureteral stone:  ?-She presented to the ED in 03/2021 with acute onset right flank pain. CT A/P 04/17/2021 demonstrated 5 mm right UVJ stone with mild right hydronephrosis. Her pain was well controlled. She denies fevers or chills. She denies dysuria. She has had some intermittent flank pain and return to the ED on 04/25/2021. Renal ultrasound that time demonstrated persistent mild right hydroureteronephrosis. Her pain is presently well controlled today without sign of infection.  ?-This is her first stone episode. She denies a history of prior episodes.  ? ?Patient currently denies fever, chills, sweats, nausea, vomiting, abdominal or flank pain, gross hematuria or dysuria.  ? ?  ?ALLERGIES: No Known Drug Allergies ?  ? ?MEDICATIONS: None  ? ?GU PSH: None  ? ?NON-GU PSH: Tonsillectomy ? ?  ? ?GU PMH: None  ? ?NON-GU PMH: Anxiety ?  ? ?FAMILY HISTORY: No Family History   ? ?SOCIAL HISTORY: Marital Status: Single ?Ethnicity: Not Hispanic Or Latino; Race: Black or African American ?Current Smoking Status: Patient has never smoked.  ? ?Tobacco Use Assessment Completed: Used Tobacco in last 30 days? ?Does drink.  ?Drinks 2 caffeinated drinks per day. ?  ? ?REVIEW OF SYSTEMS:    ?GU Review Female:   Patient reports frequent urination, hard to postpone urination, get up at night to urinate, and stream starts and stops. Patient denies burning /pain with urination, leakage of urine,  trouble starting your stream, have to strain to urinate, and being pregnant.  ?Gastrointestinal (Upper):   Patient reports nausea and vomiting. Patient denies indigestion/ heartburn.  ?Gastrointestinal (Lower):   Patient reports constipation. Patient denies diarrhea.  ?Constitutional:   Patient reports fatigue. Patient denies fever, night sweats, and weight loss.  ?Skin:   Patient denies skin rash/ lesion and itching.  ?Eyes:   Patient denies blurred vision and double vision.  ?Ears/ Nose/ Throat:   Patient reports sore throat and sinus problems.   ?Hematologic/Lymphatic:   Patient reports easy bruising. Patient denies swollen glands.  ?Cardiovascular:   Patient denies leg swelling and chest pains.  ?Respiratory:   Patient denies cough and shortness of breath.  ?Endocrine:   Patient reports excessive thirst.   ?Musculoskeletal:   Patient reports back pain. Patient denies joint pain.  ?Neurological:   Patient reports headaches. Patient denies dizziness.  ?Psychologic:   Patient reports anxiety. Patient denies depression.  ? ?VITAL SIGNS:    ?  04/30/2021 08:35 AM  ?Weight 175 lb / 79.38 kg  ?Height 60 in / 152.4 cm  ?BP 134/100 mmHg  ?Pulse 69 /min  ?Temperature 97.7 F / 36.5 C  ?BMI 34.2 kg/m?  ? ?MULTI-SYSTEM PHYSICAL EXAMINATION:    ?Constitutional: Well-nourished. No physical deformities. Normally developed. Good grooming.  ?Respiratory: No labored breathing, no use of accessory muscles.   ?Cardiovascular: Normal temperature, normal extremity pulses, no swelling, no varicosities.  ?Gastrointestinal: No mass, no tenderness, no rigidity, non obese abdomen.  ? ?  ?Complexity of Data:  ?Source Of History:  Patient, Medical Record Summary  ?Records  Review:   Previous Doctor Records  ?Urine Test Review:   Urinalysis  ?X-Ray Review: C.T. Abdomen/Pelvis: Reviewed Films. Reviewed Report. Discussed With Patient.  ?  ?Notes:                     CLINICAL DATA: Abdominal pain and emesis since 05/30 this a.m..  ? ?EXAM:  ?CT  ABDOMEN AND PELVIS WITHOUT CONTRAST  ? ?TECHNIQUE:  ?Multidetector CT imaging of the abdomen and pelvis was performed  ?following the standard protocol without IV contrast.  ? ?RADIATION DOSE REDUCTION: This exam was performed according to the  ?departmental dose-optimization program which includes automated  ?exposure control, adjustment of the mA and/or kV according to  ?patient size and/or use of iterative reconstruction technique.  ? ?COMPARISON: None.  ? ?FINDINGS:  ?Lower chest: Patchy ground-glass opacities in the right middle lobe  ?for instance on images 7 & 15/5 are consistent with an infectious  ?or inflammatory etiology.  ? ?Hepatobiliary: Unremarkable noncontrast appearance of the hepatic  ?parenchyma. Gallbladder appears normal. No biliary ductal dilation.  ? ?Pancreas: No pancreatic ductal dilation or evidence of acute  ?inflammation.  ? ?Spleen: No splenomegaly or focal splenic lesion.  ? ?Adrenals/Urinary Tract: Bilateral adrenal glands appear normal.  ? ?Right perinephric and periureteric stranding with  ?hydroureteronephrosis to the level of a 5 mm stone at the  ?ureterovesicular junction on image 78/3.  ? ?Additional punctate 2 mm right lower pole nonobstructive renal  ?calculus.  ? ?Left kidney is unremarkable without hydronephrosis or renal  ?calculus.  ? ?Stomach/Bowel: No enteric contrast was administered. Stomach is  ?unremarkable for degree of distension. No pathologic dilation of  ?small or large bowel. Terminal ileum and appendix (coronal image  ?38/6) appear normal. No evidence of acute bowel inflammation.  ? ?Vascular/Lymphatic: Normal caliber abdominal aorta. No  ?pathologically enlarged abdominal or pelvic lymph nodes.  ? ?Reproductive: Uterus and left adnexa appear normal. 3.4 cm right  ?ovarian cyst.  ? ?Other: Trace pelvic free fluid, within physiologic normal limits.  ? ?Musculoskeletal: No acute or significant osseous findings.  ? ?IMPRESSION:  ?1. 5 mm obstructing stone at the  right ureterovesicular junction  ?with associated right hydroureteronephrosis.  ?2. Additional punctate 2 mm right lower pole nonobstructive renal  ?calculus.  ?3. Patchy ground-glass opacities in the right middle lobe are  ?consistent with an infectious or inflammatory etiology.  ? ? ?Electronically Signed  ?By: Dahlia Bailiff M.D.  ?On: 04/17/2021 12:44  ? ?PROCEDURES:    ? ?     Urinalysis w/Scope ?Dipstick Dipstick Cont'd Micro  ?Color: Yellow Bilirubin: Neg mg/dL WBC/hpf: 0 - 5/hpf  ?Appearance: Cloudy Ketones: Neg mg/dL RBC/hpf: 3 - 10/hpf  ?Specific Gravity: 1.025 Blood: 2+ ery/uL Bacteria: Few (10-25/hpf)  ?pH: 6.5 Protein: Trace mg/dL Cystals: NS (Not Seen)  ?Glucose: Neg mg/dL Urobilinogen: 0.2 mg/dL Casts: NS (Not Seen)  ?  Nitrites: Neg Trichomonas: Not Present  ?  Leukocyte Esterase: Trace leu/uL Mucous: Present  ?    Epithelial Cells: 0 - 5/hpf  ?    Yeast: NS (Not Seen)  ?    Sperm: Not Present  ? ? ?ASSESSMENT:  ?    ICD-10 Details  ?1 GU:   Ureteral calculus - N20.1   ?2   Low back pain - M54.5   ? ?PLAN:    ? ? ?      Medications ?New Meds: Tamsulosin Hcl 0.4 mg capsule 1 capsule PO Q HS   #30  1  Refill(s)  ?Pharmacy Name:  [facility] #[id number]  ?Address:  [address]  ? [place], [place] [address]  ?Phone:  [phone]  ?Fax:  [phone]  ?  ? ? ?      Document ?Letter(s):  Created for Patient: Clinical Summary  ? ? ?     Notes:   #1. Right ureteral stone:  ?-CT A/P 04/17/2021: 59mm R UVJ stone and 86mm RLP stone  ?We discussed options of continued medical expulsive therapy, ESWL or ureteroscopy with laser lithotripsy and basket traction of stone. She elects for ureteroscopy. Discussed risk and benefits. Surgery letter sent. We will send urine for culture preoperatively.  ?Discussed and prevention as below.  ? ?We discussed the options for management of kidney stones, including observation, ESWL, ureteroscopy with laser lithotripsy, and PCNL. The risks and benefits of each option were discussed.   ?For observation I described the risks which include but are not limited to silent renal damage, life-threatening infection, need for emergent surgery, failure to pass stone, and pain.  ? ?ESWL: risks and bene

## 2021-06-04 NOTE — Discharge Instructions (Addendum)
[facility] ?[phone] ?Post Ureteroscopy With or Without Stent Instructions ? ?Definitions: ? ?Ureter: The duct that transports urine from the kidney to the bladder. ?Stent:   A plastic hollow tube that is placed into the ureter, from the kidney to the bladder to prevent the ureter from swelling shut. ? ?GENERAL INSTRUCTIONS: ? ?Despite the fact that no skin incisions were used, the area around the ureter and bladder is raw and irritated. The stent is a foreign body which will further irritate the bladder wall. This irritation is manifested by increased frequency of urination, both day and night, and by an increase in the urge to urinate. In some, the urge to urinate is present almost always. Sometimes the urge is strong enough that you may not be able to stop yourself from urinating. The only real cure is to remove the stent and then give time for the bladder wall to heal which can't be done until the danger of the ureter swelling shut has passed, which varies. ? ?You may see some blood in your urine while the stent is in place and a few days afterwards. Do not be alarmed, even if the urine was clear for a while. Get off your feet and drink lots of fluids until clearing occurs. If you start to pass clots or don't improve, call us. ? ?DIET: ?You may return to your normal diet immediately. Because of the raw surface of your bladder, alcohol, spicy foods, acid type foods and drinks with caffeine may cause irritation or frequency and should be used in moderation. To keep your urine flowing freely and to avoid constipation, drink plenty of fluids during the day ( 8-10 glasses ). ?Tip: Avoid cranberry juice because it is very acidic. ? ?ACTIVITY: ?Your physical activity doesn't need to be restricted. However, if you are very active, you may see some blood in your urine. We suggest that you reduce your activity under these circumstances until the bleeding has stopped. ? ?BOWELS: ?It is important to  keep your bowels regular during the postoperative period. Straining with bowel movements can cause bleeding. A bowel movement every other day is reasonable. Use a mild laxative if needed, such as Milk of Magnesia 2-3 tablespoons, or 2 Dulcolax tablets. Call if you continue to have problems. If you have been taking narcotics for pain, before, during or after your surgery, you may be constipated. Take a laxative if necessary. ? ? ?MEDICATION: ?You should resume your pre-surgery medications unless told not to. In addition you will often be given an antibiotic to prevent infection. These should be taken as prescribed until the bottles are finished unless you are having an unusual reaction to one of the drugs. ? ?PROBLEMS YOU SHOULD REPORT TO [place]: ?Fevers over 100.5 Fahrenheit. ?Heavy bleeding, or clots ( See above notes about blood in urine ). ?Inability to urinate. ?Drug reactions ( hives, rash, nausea, vomiting, diarrhea ). ?Severe burning or pain with urination that is not improving. ? ?FOLLOW-UP: ?You will need a follow-up appointment to monitor your progress. Call for this appointment at the number listed above. Usually the first appointment will be about three to fourteen days after your surgery. ? ?You have a ureteral stent draining your kidney. You may remove this by pulling on attached string in the morning of your third day after your surgery.  ? ?No acetaminophen/Tylenol until after 4:30pm today if needed  for pain.  ? ?No ibuprofen, Advil, Aleve, Motrin, ketorolac, meloxicam, naproxen, or other NSAIDS until after 6:45pm  today if needed for pain.  ? ? ? ?Post Anesthesia Home Care Instructions ? ?Activity: ?Get plenty of rest for the remainder of the day. A responsible individual must stay with you for 24 hours following the procedure.  ?For the next 24 hours, DO NOT: ?-Drive a car ?-Advertising copywriter ?-Drink alcoholic beverages ?-Take any medication unless instructed by your physician ?-Make any legal  decisions or sign important papers. ? ?Meals: ?Start with liquid foods such as gelatin or soup. Progress to regular foods as tolerated. Avoid greasy, spicy, heavy foods. If nausea and/or vomiting occur, drink only clear liquids until the nausea and/or vomiting subsides. Call your physician if vomiting continues. ? ?Special Instructions/Symptoms: ?Your throat may feel dry or sore from the anesthesia or the breathing tube placed in your throat during surgery. If this causes discomfort, gargle with warm salt water. The discomfort should disappear within 24 hours. ? ?

## 2021-06-05 ENCOUNTER — Encounter (HOSPITAL_BASED_OUTPATIENT_CLINIC_OR_DEPARTMENT_OTHER): Payer: Self-pay | Admitting: Urology

## 2021-06-05 NOTE — Anesthesia Postprocedure Evaluation (Signed)
Anesthesia Post Note ? ?Patient: Janice Fisher ? ?Procedure(s) Performed: CYSTOSCOPY/ RETROGRADE/URETEROSCOPY/HOLMIUM LASER/STENT PLACEMENT (Right: Ureter) ? ?  ? ?Patient location during evaluation: PACU ?Anesthesia Type: General ?Level of consciousness: awake and alert ?Pain management: pain level controlled ?Vital Signs Assessment: post-procedure vital signs reviewed and stable ?Respiratory status: spontaneous breathing, nonlabored ventilation, respiratory function stable and patient connected to nasal cannula oxygen ?Cardiovascular status: blood pressure returned to baseline and stable ?Postop Assessment: no apparent nausea or vomiting ?Anesthetic complications: no ? ? ?No notable events documented. ? ?Last Vitals:  ?Vitals:  ? 06/04/21 1400 06/04/21 1436  ?BP: (!) 126/92 120/74  ?Pulse:  75  ?Resp:  19  ?Temp: 36.8 ?C 36.7 ?C  ?SpO2:  100%  ?  ?Last Pain:  ?Vitals:  ? 06/04/21 1436  ?TempSrc:   ?PainSc: 6   ? ? ?  ?  ?  ?  ?  ?  ? ?Thanh Mottern L Breyanna Valera ? ? ? ? ?

## 2021-10-19 ENCOUNTER — Emergency Department (HOSPITAL_BASED_OUTPATIENT_CLINIC_OR_DEPARTMENT_OTHER)
Admission: EM | Admit: 2021-10-19 | Discharge: 2021-10-20 | Disposition: A | Discharge status: Home / Self Care | Payer: 59 | Attending: Emergency Medicine | Admitting: Emergency Medicine

## 2021-10-19 ENCOUNTER — Other Ambulatory Visit: Payer: Self-pay

## 2021-10-19 ENCOUNTER — Encounter (HOSPITAL_BASED_OUTPATIENT_CLINIC_OR_DEPARTMENT_OTHER): Payer: Self-pay | Admitting: Emergency Medicine

## 2021-10-19 DIAGNOSIS — R3129 Other microscopic hematuria: Secondary | ICD-10-CM

## 2021-10-19 DIAGNOSIS — M545 Low back pain, unspecified: Secondary | ICD-10-CM | POA: Diagnosis not present

## 2021-10-19 DIAGNOSIS — R0789 Other chest pain: Secondary | ICD-10-CM | POA: Insufficient documentation

## 2021-10-19 DIAGNOSIS — Z20822 Contact with and (suspected) exposure to covid-19: Secondary | ICD-10-CM | POA: Diagnosis not present

## 2021-10-19 DIAGNOSIS — R059 Cough, unspecified: Secondary | ICD-10-CM | POA: Insufficient documentation

## 2021-10-19 DIAGNOSIS — Z8616 Personal history of COVID-19: Secondary | ICD-10-CM | POA: Diagnosis not present

## 2021-10-19 LAB — BASIC METABOLIC PANEL
Anion gap: 9 (ref 5–15)
BUN: 12 mg/dL (ref 6–20)
CO2: 27 mmol/L (ref 22–32)
Calcium: 10 mg/dL (ref 8.9–10.3)
Chloride: 106 mmol/L (ref 98–111)
Creatinine, Ser: 0.85 mg/dL (ref 0.44–1.00)
GFR, Estimated: >60 mL/min (ref >60)
Glucose, Bld: 96 mg/dL (ref 70–99)
Potassium: 3.7 mmol/L (ref 3.5–5.1)
Sodium: 142 mmol/L (ref 135–145)

## 2021-10-19 LAB — CBC
HCT: 39.9 % (ref 36.0–46.0)
Hemoglobin: 12.7 g/dL (ref 12.0–15.0)
MCH: 28.8 pg (ref 26.0–34.0)
MCHC: 31.8 g/dL (ref 30.0–36.0)
MCV: 90.5 fL (ref 80.0–100.0)
Platelets: 360 10*3/uL (ref 150–400)
RBC: 4.41 MIL/uL (ref 3.87–5.11)
RDW: 14.4 % (ref 11.5–15.5)
WBC: 12.3 10*3/uL — ABNORMAL HIGH (ref 4.0–10.5)
nRBC: 0 % (ref 0.0–0.2)

## 2021-10-19 LAB — PREGNANCY, URINE: Preg Test, Ur: NEGATIVE

## 2021-10-19 LAB — TROPONIN I (HIGH SENSITIVITY): Troponin I (High Sensitivity): <2 ng/L (ref <18)

## 2021-10-19 NOTE — ED Triage Notes (Signed)
Tightness in chest/chest pain. Only happens when swallowing. Started last week with congestion. Has been taking mucinex. Also complains of back pain

## 2021-10-20 ENCOUNTER — Emergency Department (HOSPITAL_BASED_OUTPATIENT_CLINIC_OR_DEPARTMENT_OTHER): Payer: 59

## 2021-10-20 LAB — URINALYSIS, ROUTINE W REFLEX MICROSCOPIC
Bilirubin Urine: NEGATIVE
Glucose, UA: NEGATIVE mg/dL
Ketones, ur: NEGATIVE mg/dL
Nitrite: NEGATIVE
RBC / HPF: >50 RBC/hpf — ABNORMAL HIGH (ref 0–5)
Specific Gravity, Urine: 1.026 (ref 1.005–1.030)
pH: 5.5 (ref 5.0–8.0)

## 2021-10-20 LAB — RESP PANEL BY RT-PCR (FLU A&B, COVID) ARPGX2
Influenza A by PCR: NEGATIVE
Influenza B by PCR: NEGATIVE
SARS Coronavirus 2 by RT PCR: NEGATIVE

## 2021-10-20 MED ORDER — ACETAMINOPHEN 325 MG PO TABS
650.0000 mg | ORAL_TABLET | Freq: Once | ORAL | Status: AC
  Administered 2021-10-20: 650 mg via ORAL
  Filled 2021-10-20: qty 2

## 2021-10-20 MED ORDER — ALUM & MAG HYDROXIDE-SIMETH 200-200-20 MG/5ML PO SUSP
30.0000 mL | Freq: Once | ORAL | Status: AC
  Administered 2021-10-20: 30 mL via ORAL
  Filled 2021-10-20: qty 30

## 2021-10-20 MED ORDER — FAMOTIDINE 20 MG PO TABS: 20.0000 mg | ORAL_TABLET | Freq: Two times a day (BID) | ORAL | 0 refills | Status: AC

## 2021-10-20 NOTE — ED Notes (Signed)
Late entry -- Pt awake and alert; GCS 15.  RR even and unlabored on RA with symmetrical rise and fall of chest.  Pt reports ongoing R side back pain; h/o kidney stones 04/2021 (UA showing elevated RBC)

## 2021-10-20 NOTE — ED Notes (Signed)
Provider now at bedside to discuss patient concerns
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
## 2021-10-20 NOTE — ED Notes (Signed)
Entered room to review d/c instructions and pt asked "so he's not gonna test me for kidney stones" -- pt stating she would like to know for certain and would like imaging to confirm or rule out kidney stones-- Dr. Eudelia Bunch notified via secure chat of patient request; awaiting reply

## 2022-01-02 ENCOUNTER — Ambulatory Visit
Admission: RE | Admit: 2022-01-02 | Discharge: 2022-01-02 | Disposition: A | Discharge status: Home / Self Care | Payer: 59 | Source: Ambulatory Visit | Attending: Family Medicine | Admitting: Family Medicine

## 2022-01-02 ENCOUNTER — Other Ambulatory Visit: Payer: Self-pay | Admitting: Family Medicine

## 2022-01-02 DIAGNOSIS — M79662 Pain in left lower leg: Secondary | ICD-10-CM

## 2022-03-22 ENCOUNTER — Emergency Department (HOSPITAL_BASED_OUTPATIENT_CLINIC_OR_DEPARTMENT_OTHER)
Admission: EM | Admit: 2022-03-22 | Discharge: 2022-03-22 | Disposition: A | Discharge status: Home / Self Care | Payer: 59 | Attending: Emergency Medicine | Admitting: Emergency Medicine

## 2022-03-22 ENCOUNTER — Other Ambulatory Visit: Payer: Self-pay

## 2022-03-22 ENCOUNTER — Encounter (HOSPITAL_BASED_OUTPATIENT_CLINIC_OR_DEPARTMENT_OTHER): Payer: Self-pay | Admitting: Emergency Medicine

## 2022-03-22 ENCOUNTER — Emergency Department (HOSPITAL_BASED_OUTPATIENT_CLINIC_OR_DEPARTMENT_OTHER): Payer: 59

## 2022-03-22 DIAGNOSIS — M545 Low back pain, unspecified: Secondary | ICD-10-CM | POA: Insufficient documentation

## 2022-03-22 DIAGNOSIS — R109 Unspecified abdominal pain: Secondary | ICD-10-CM | POA: Diagnosis present

## 2022-03-22 LAB — CBC WITH DIFFERENTIAL/PLATELET
Abs Immature Granulocytes: 0.03 10*3/uL (ref 0.00–0.07)
Basophils Absolute: 0 10*3/uL (ref 0.0–0.1)
Basophils Relative: 0 %
Eosinophils Absolute: 0 10*3/uL (ref 0.0–0.5)
Eosinophils Relative: 0 %
HCT: 36.3 % (ref 36.0–46.0)
Hemoglobin: 11.6 g/dL — ABNORMAL LOW (ref 12.0–15.0)
Immature Granulocytes: 0 %
Lymphocytes Relative: 25 %
Lymphs Abs: 3 10*3/uL (ref 0.7–4.0)
MCH: 29.4 pg (ref 26.0–34.0)
MCHC: 32 g/dL (ref 30.0–36.0)
MCV: 92.1 fL (ref 80.0–100.0)
Monocytes Absolute: 0.9 10*3/uL (ref 0.1–1.0)
Monocytes Relative: 8 %
Neutro Abs: 7.8 10*3/uL — ABNORMAL HIGH (ref 1.7–7.7)
Neutrophils Relative %: 67 %
Platelets: 266 10*3/uL (ref 150–400)
RBC: 3.94 MIL/uL (ref 3.87–5.11)
RDW: 14.8 % (ref 11.5–15.5)
WBC: 11.7 10*3/uL — ABNORMAL HIGH (ref 4.0–10.5)
nRBC: 0 % (ref 0.0–0.2)

## 2022-03-22 LAB — URINALYSIS, ROUTINE W REFLEX MICROSCOPIC

## 2022-03-22 LAB — BASIC METABOLIC PANEL
Anion gap: 7 (ref 5–15)
BUN: 9 mg/dL (ref 6–20)
CO2: 27 mmol/L (ref 22–32)
Calcium: 9.3 mg/dL (ref 8.9–10.3)
Chloride: 107 mmol/L (ref 98–111)
Creatinine, Ser: 0.66 mg/dL (ref 0.44–1.00)
GFR, Estimated: >60 mL/min (ref >60)
Glucose, Bld: 85 mg/dL (ref 70–99)
Potassium: 3.9 mmol/L (ref 3.5–5.1)
Sodium: 141 mmol/L (ref 135–145)

## 2022-03-22 LAB — URINALYSIS, MICROSCOPIC (REFLEX)
Bacteria, UA: NONE SEEN
RBC / HPF: >50 RBC/hpf (ref 0–5)

## 2022-03-22 LAB — PREGNANCY, URINE: Preg Test, Ur: NEGATIVE

## 2022-03-22 MED ORDER — KETOROLAC TROMETHAMINE 15 MG/ML IJ SOLN
15.0000 mg | Freq: Once | INTRAMUSCULAR | Status: AC
  Administered 2022-03-22: 15 mg via INTRAVENOUS
  Filled 2022-03-22: qty 1

## 2022-03-22 MED ORDER — ONDANSETRON HCL 4 MG/2ML IJ SOLN
4.0000 mg | Freq: Once | INTRAMUSCULAR | Status: AC
Start: 2022-03-22 — End: 2022-03-22
  Administered 2022-03-22: 4 mg via INTRAVENOUS
  Filled 2022-03-22: qty 2

## 2022-03-22 MED ORDER — ONDANSETRON 4 MG PO TBDP: 4.0000 mg | ORAL_TABLET | Freq: Three times a day (TID) | ORAL | 0 refills | Status: AC | PRN

## 2022-03-22 MED ORDER — NAPROXEN 500 MG PO TABS: 500.0000 mg | ORAL_TABLET | Freq: Two times a day (BID) | ORAL | 0 refills | Status: DC

## 2022-03-22 NOTE — Discharge Instructions (Signed)
Please read and follow all provided instructions.  Your diagnoses today include:  1. Flank pain     Tests performed today include: Blood cell counts and platelets Kidney and liver function tests Urine test to look for infection: bloody urine A blood or urine test for pregnancy (women only) CT scan of the abdomen and pelvis: Shows a small stone inside of the kidney but no other problems Vital signs. See below for your results today.   Medications prescribed:  Naproxen - anti-inflammatory pain medication Do not exceed 500mg  naproxen every 12 hours, take with food  You have been prescribed an anti-inflammatory medication or NSAID. Take with food. Take smallest effective dose for the shortest duration needed for your pain. Stop taking if you experience stomach pain or vomiting.   Zofran (ondansetron) - for nausea and vomiting  Take any prescribed medications only as directed.  Home care instructions:  Follow any educational materials contained in this packet.  Follow-up instructions: Please follow-up with your primary care provider in the next 3 days for further evaluation of your symptoms.    Return instructions:  SEEK IMMEDIATE MEDICAL ATTENTION IF: The pain does not go away or becomes severe  A temperature above 101F develops  Repeated vomiting occurs (multiple episodes)  The pain becomes localized to portions of the abdomen. The right side could possibly be appendicitis. In an adult, the left lower portion of the abdomen could be colitis or diverticulitis.  Blood is being passed in stools or vomit (bright red or black tarry stools)  You develop chest pain, difficulty breathing, dizziness or fainting, or become confused, poorly responsive, or inconsolable (young children) If you have any other emergent concerns regarding your health  Additional Information: Abdominal (belly) pain can be caused by many things. Your caregiver performed an examination and possibly ordered  blood/urine tests and imaging (CT scan, x-rays, ultrasound). Many cases can be observed and treated at home after initial evaluation in the emergency department. Even though you are being discharged home, abdominal pain can be unpredictable. Therefore, you need a repeated exam if your pain does not resolve, returns, or worsens. Most patients with abdominal pain don't have to be admitted to the hospital or have surgery, but serious problems like appendicitis and gallbladder attacks can start out as nonspecific pain. Many abdominal conditions cannot be diagnosed in one visit, so follow-up evaluations are very important.  Your vital signs today were: BP 127/76 (BP Location: Left Arm)   Pulse 65   Temp 99.3 F (37.4 C) (Oral)   Resp 14   Ht 5' (1.524 m)   Wt 79.4 kg   SpO2 99%   BMI 34.18 kg/m  If your blood pressure (bp) was elevated above 135/85 this visit, please have this repeated by your doctor within one month. --------------

## 2022-03-22 NOTE — ED Provider Notes (Signed)
[place] EMERGENCY DEPARTMENT AT [facility] Provider Note   CSN: [id number] Arrival date & time: 03/22/22  1618     History  Chief Complaint  Patient presents with   Flank Pain    Janice Fisher is a 27 y.o. female.  Patient with history of kidney stones presents to the emergency department for 2 to 3 days of intermittent right-sided flank pain, reminiscent of previous stones, but more persistent today prompting emergency department visit.  No fevers, vomiting.  No diarrhea or constipation.  She has noted blood in the urine but she is also on her menstrual period.  No concern for pregnancy.  No anterior abdominal pain.  Pain is right flank, right lower back.  For previous stone last year, she needed a surgical procedure by urology to remove the stone.  She thinks that she has passed several small stones at other times.  No other abdominal surgeries.      Home Medications Prior to Admission medications   Medication Sig Start Date End Date Taking? Authorizing Provider  docusate sodium (COLACE) 100 MG capsule Take 1 capsule (100 mg total) by mouth daily as needed for up to 30 doses. 06/04/21   Jannifer Hick, MD  famotidine (PEPCID) 20 MG tablet Take 1 tablet (20 mg total) by mouth 2 (two) times daily. 10/20/21   Nira Conn, MD  hydrOXYzine (ATARAX) 25 MG tablet Take 25 mg by mouth 3 (three) times daily as needed.    [provider]  naproxen (NAPROSYN) 375 MG tablet Take 1 tablet (375 mg total) by mouth 2 (two) times daily with a meal. Patient taking differently: Take 375 mg by mouth 2 (two) times daily as needed. 08/15/20   Wallis Bamberg, PA-C  oxyCODONE-acetaminophen (PERCOCET) 5-325 MG tablet Take 1 tablet by mouth every 4 (four) hours as needed for up to 12 doses for severe pain. 06/04/21   Jannifer Hick, MD      Allergies    Patient has no known allergies.    Review of Systems   Review of Systems  Physical Exam Updated Vital Signs BP (!) 136/95  (BP Location: Right Arm)   Pulse 70   Temp 98.5 F (36.9 C) (Oral)   Resp 16   Ht 5' (1.524 m)   Wt 79.4 kg   SpO2 99%   BMI 34.18 kg/m   Physical Exam Vitals and nursing note reviewed.  Constitutional:      General: She is not in acute distress.    Appearance: She is well-developed.  HENT:     Head: Normocephalic and atraumatic.     Right Ear: External ear normal.     Left Ear: External ear normal.     Nose: Nose normal.  Eyes:     Conjunctiva/sclera: Conjunctivae normal.  Cardiovascular:     Rate and Rhythm: Normal rate and regular rhythm.     Heart sounds: No murmur heard. Pulmonary:     Effort: No respiratory distress.     Breath sounds: No wheezing, rhonchi or rales.  Abdominal:     Palpations: Abdomen is soft.     Tenderness: There is no abdominal tenderness. There is no right CVA tenderness, left CVA tenderness, guarding or rebound.  Musculoskeletal:     Cervical back: Normal range of motion and neck supple.     Right lower leg: No edema.     Left lower leg: No edema.     Comments: Mild right lower back and paraspinous tenderness  to palpation.  Skin:    General: Skin is warm and dry.     Findings: No rash.  Neurological:     General: No focal deficit present.     Mental Status: She is alert. Mental status is at baseline.     Motor: No weakness.  Psychiatric:        Mood and Affect: Mood normal.     ED Results / Procedures / Treatments   Labs (all labs ordered are listed, but only abnormal results are displayed) Labs Reviewed  URINALYSIS, ROUTINE W REFLEX MICROSCOPIC - Abnormal; Notable for the following components:      Result Value   Color, Urine RED (*)    APPearance CLOUDY (*)    Glucose, UA   (*)    Value: TEST NOT REPORTED DUE TO COLOR INTERFERENCE OF URINE PIGMENT   Hgb urine dipstick   (*)    Value: TEST NOT REPORTED DUE TO COLOR INTERFERENCE OF URINE PIGMENT   Bilirubin Urine   (*)    Value: TEST NOT REPORTED DUE TO COLOR INTERFERENCE OF  URINE PIGMENT   Ketones, ur   (*)    Value: TEST NOT REPORTED DUE TO COLOR INTERFERENCE OF URINE PIGMENT   Protein, ur   (*)    Value: TEST NOT REPORTED DUE TO COLOR INTERFERENCE OF URINE PIGMENT   Nitrite   (*)    Value: TEST NOT REPORTED DUE TO COLOR INTERFERENCE OF URINE PIGMENT   Leukocytes,Ua   (*)    Value: TEST NOT REPORTED DUE TO COLOR INTERFERENCE OF URINE PIGMENT   All other components within normal limits  PREGNANCY, URINE  URINALYSIS, MICROSCOPIC (REFLEX)    EKG None  Radiology No results found.  Procedures Procedures    Medications Ordered in ED Medications  ketorolac (TORADOL) 15 MG/ML injection 15 mg (has no administration in time range)  ondansetron (ZOFRAN) injection 4 mg (has no administration in time range)    ED Course/ Medical Decision Making/ A&P    Patient seen and examined. History obtained directly from patient. Work-up including labs, imaging, EKG ordered in triage, if performed, were reviewed.    Labs/EKG: Independently reviewed and interpreted.  This included: UA consistent with hematuria, pregnancy negative.  Ordered CBC, CMP.  Imaging: Independently visualized and interpreted.  This included: CT renal protocol.  Medications/Fluids: Ordered: IV Zofran, IV Toradol   Most recent vital signs reviewed and are as follows: BP (!) 136/95 (BP Location: Right Arm)   Pulse 70   Temp 98.5 F (36.9 C) (Oral)   Resp 16   Ht 5' (1.524 m)   Wt 79.4 kg   SpO2 99%   BMI 34.18 kg/m   Initial impression: Right-sided flank pain  9:42 PM Reassessment performed. Patient appears comfortable.  She states that she is feeling better with treatment.  No vomiting.  Pain controlled.  Labs personally reviewed and interpreted including: CBC with mildly elevated white blood cell count, mild anemia at 11.6 otherwise unremarkable; BMP with normal electrolytes and kidney function.  Imaging personally visualized and interpreted including: CT renal protocol, agree  no obstructive ureteral stones or hydronephrosis, other areas inflammation or swelling in the abdomen.  Reviewed pertinent lab work and imaging with patient at bedside. Questions answered.   Most current vital signs reviewed and are as follows: BP 127/76 (BP Location: Left Arm)   Pulse 65   Temp 99.3 F (37.4 C) (Oral)   Resp 14   Ht 5' (1.524 m)  Wt 79.4 kg   SpO2 99%   BMI 34.18 kg/m   Plan: Discharge to home.   Prescriptions written for: Naproxen, Zofran  Other home care instructions discussed: Bland diet, maintain good hydration  ED return instructions discussed: The patient was urged to return to the Emergency Department immediately with worsening of current symptoms, worsening abdominal pain, persistent vomiting, blood noted in stools, fever, or any other concerns. The patient verbalized understanding.   Follow-up instructions discussed: Patient encouraged to follow-up with their PCP in 3 days.    Click here for ABCD2, HEART and other calculatorsREFRESH Note before signing :1}                          Medical Decision Making Amount and/or Complexity of Data Reviewed Labs: ordered. Radiology: ordered.  Risk Prescription drug management.   For this patient's complaint of abdominal pain, the following conditions were considered on the differential diagnosis: gastritis/PUD, enteritis/duodenitis, appendicitis, cholelithiasis/cholecystitis, cholangitis, pancreatitis, ruptured viscus, colitis, diverticulitis, small/large bowel obstruction, proctitis, cystitis, pyelonephritis, ureteral colic, aortic dissection, aortic aneurysm. In women, ectopic pregnancy, pelvic inflammatory disease, ovarian cysts, and tubo-ovarian abscess were also considered. Atypical chest etiologies were also considered including ACS, PE, and pneumonia.  The patient's vital signs, pertinent lab work and imaging were reviewed and interpreted as discussed in the ED course. Hospitalization was considered for  further testing, treatments, or serial exams/observation. However as patient is well-appearing, has a stable exam, and reassuring studies today, I do not feel that they warrant admission at this time. This plan was discussed with the patient who verbalizes agreement and comfort with this plan and seems reliable and able to return to the Emergency Department with worsening or changing symptoms.          Final Clinical Impression(s) / ED Diagnoses Final diagnoses:  Flank pain    Rx / DC Orders ED Discharge Orders          Ordered    naproxen (NAPROSYN) 500 MG tablet  2 times daily        03/22/22 2109    ondansetron (ZOFRAN-ODT) 4 MG disintegrating tablet  Every 8 hours PRN        03/22/22 2109              Carlisle Cater, PA-C 03/22/22 2143    Blanchie Dessert, MD 03/23/22 0015

## 2022-03-22 NOTE — ED Triage Notes (Signed)
Pt arrives to ED with c/o right sided flank pain x3 days.

## 2022-11-04 ENCOUNTER — Emergency Department (HOSPITAL_BASED_OUTPATIENT_CLINIC_OR_DEPARTMENT_OTHER)
Admission: EM | Admit: 2022-11-04 | Discharge: 2022-11-04 | Disposition: A | Discharge status: Home / Self Care | Payer: 59 | Attending: Emergency Medicine | Admitting: Emergency Medicine

## 2022-11-04 ENCOUNTER — Other Ambulatory Visit: Payer: Self-pay

## 2022-11-04 ENCOUNTER — Other Ambulatory Visit (HOSPITAL_BASED_OUTPATIENT_CLINIC_OR_DEPARTMENT_OTHER): Payer: Self-pay

## 2022-11-04 ENCOUNTER — Encounter (HOSPITAL_BASED_OUTPATIENT_CLINIC_OR_DEPARTMENT_OTHER): Payer: Self-pay | Admitting: Emergency Medicine

## 2022-11-04 ENCOUNTER — Emergency Department (HOSPITAL_BASED_OUTPATIENT_CLINIC_OR_DEPARTMENT_OTHER): Payer: 59

## 2022-11-04 DIAGNOSIS — R109 Unspecified abdominal pain: Secondary | ICD-10-CM | POA: Diagnosis present

## 2022-11-04 DIAGNOSIS — N200 Calculus of kidney: Secondary | ICD-10-CM | POA: Insufficient documentation

## 2022-11-04 LAB — COMPREHENSIVE METABOLIC PANEL
ALT: 10 U/L (ref 0–44)
AST: 13 U/L — ABNORMAL LOW (ref 15–41)
Albumin: 4.3 g/dL (ref 3.5–5.0)
Alkaline Phosphatase: 55 U/L (ref 38–126)
Anion gap: 8 (ref 5–15)
BUN: 11 mg/dL (ref 6–20)
CO2: 26 mmol/L (ref 22–32)
Calcium: 9.2 mg/dL (ref 8.9–10.3)
Chloride: 105 mmol/L (ref 98–111)
Creatinine, Ser: 0.77 mg/dL (ref 0.44–1.00)
GFR, Estimated: >60 mL/min (ref >60)
Glucose, Bld: 98 mg/dL (ref 70–99)
Potassium: 3.8 mmol/L (ref 3.5–5.1)
Sodium: 139 mmol/L (ref 135–145)
Total Bilirubin: 0.6 mg/dL (ref 0.3–1.2)
Total Protein: 6.6 g/dL (ref 6.5–8.1)

## 2022-11-04 LAB — URINALYSIS, ROUTINE W REFLEX MICROSCOPIC
Bacteria, UA: NONE SEEN
Bilirubin Urine: NEGATIVE
Glucose, UA: NEGATIVE mg/dL
Nitrite: NEGATIVE
Protein, ur: 30 mg/dL — AB
RBC / HPF: >50 RBC/hpf (ref 0–5)
Specific Gravity, Urine: 1.026 (ref 1.005–1.030)
pH: 6 (ref 5.0–8.0)

## 2022-11-04 LAB — CBC WITH DIFFERENTIAL/PLATELET
Abs Immature Granulocytes: 0.05 10*3/uL (ref 0.00–0.07)
Basophils Absolute: 0 10*3/uL (ref 0.0–0.1)
Basophils Relative: 0 %
Eosinophils Absolute: 0 10*3/uL (ref 0.0–0.5)
Eosinophils Relative: 0 %
HCT: 36.9 % (ref 36.0–46.0)
Hemoglobin: 11.8 g/dL — ABNORMAL LOW (ref 12.0–15.0)
Immature Granulocytes: 1 %
Lymphocytes Relative: 18 %
Lymphs Abs: 2 10*3/uL (ref 0.7–4.0)
MCH: 29.2 pg (ref 26.0–34.0)
MCHC: 32 g/dL (ref 30.0–36.0)
MCV: 91.3 fL (ref 80.0–100.0)
Monocytes Absolute: 0.9 10*3/uL (ref 0.1–1.0)
Monocytes Relative: 9 %
Neutro Abs: 7.7 10*3/uL (ref 1.7–7.7)
Neutrophils Relative %: 72 %
Platelets: 260 10*3/uL (ref 150–400)
RBC: 4.04 MIL/uL (ref 3.87–5.11)
RDW: 14.5 % (ref 11.5–15.5)
WBC: 10.6 10*3/uL — ABNORMAL HIGH (ref 4.0–10.5)
nRBC: 0 % (ref 0.0–0.2)

## 2022-11-04 LAB — PREGNANCY, URINE: Preg Test, Ur: NEGATIVE

## 2022-11-04 LAB — LIPASE, BLOOD: Lipase: 13 U/L (ref 11–51)

## 2022-11-04 MED ORDER — ONDANSETRON HCL 4 MG/2ML IJ SOLN
4.0000 mg | Freq: Once | INTRAMUSCULAR | Status: AC
  Administered 2022-11-04: 4 mg via INTRAVENOUS
  Filled 2022-11-04: qty 2

## 2022-11-04 MED ORDER — MORPHINE SULFATE (PF) 4 MG/ML IV SOLN
4.0000 mg | Freq: Once | INTRAVENOUS | Status: AC
  Administered 2022-11-04: 4 mg via INTRAVENOUS
  Filled 2022-11-04: qty 1

## 2022-11-04 MED ORDER — OXYCODONE HCL 5 MG PO TABS
5.0000 mg | ORAL_TABLET | ORAL | 0 refills | Status: AC | PRN
Start: 2022-11-04 — End: ?
  Filled 2022-11-04: qty 12, 2d supply, fill #0

## 2022-11-04 NOTE — ED Notes (Signed)
Discharge instructions, follow up care, and pain management reviewed and explained. Pt verbalized understanding and had no further questions on d/c. Pt caox4, ambulatory, NAD on d/c.  

## 2022-11-04 NOTE — Discharge Instructions (Addendum)
You were seen for your kidney stone in the emergency department.    At home, please take Tylenol and ibuprofen for your pain. You may also take the oxycodone we have prescribed you for any breakthrough pain that may have.  Do not take this before driving or operating heavy machinery.  Do not take this medication with alcohol.  Take your kidney stone to your urologist appointment.  Follow-up with urology in a week to discuss your symptoms.   Return immediately to the emergency department if you experience any of the following: fever, unbearable pain, urinary retention, or any other concerning symptoms.    Thank you for visiting our Emergency Department. It was a pleasure taking care of you today.

## 2022-11-04 NOTE — ED Notes (Signed)
Pt discharged by a different RN.

## 2022-11-04 NOTE — ED Triage Notes (Signed)
Pt via pov from home with right flank pain today. She reports recent hx of kidney stones, was told there were stones that were not moving, but she feels like they are moving now. Endorses n/v, denies hematuria. Pt alert & oriented, nad noted.

## 2022-11-04 NOTE — ED Provider Notes (Signed)
[place] EMERGENCY DEPARTMENT AT [facility] Provider Note   CSN: [id number] Arrival date & time: 11/04/22  1112     History {Add pertinent medical, surgical, social history, OB history to HPI:1} Chief Complaint  Patient presents with   Flank Pain    Theres Meininger is a 27 y.o. female.  27 year old female with a history of kidney stones who presents emergency department with right flank pain.  Patient reports that over night she started experiencing right-sided flank pain that radiates to her right groin.  It is colicky in nature.  7/10 in severity at this time.  Feels identical to prior kidney stones.  Took naproxen this morning without significant improvement of her pain.  Did have 1 episode of nonbloody nonbilious emesis.  Denies any fevers, chills, dysuria, frequency, vaginal discharge or bleeding.  No diarrhea.  No other abdominal surgeries.       Home Medications Prior to Admission medications   Medication Sig Start Date End Date Taking? Authorizing Provider  famotidine (PEPCID) 20 MG tablet Take 1 tablet (20 mg total) by mouth 2 (two) times daily. 10/20/21   Nira Conn, MD  hydrOXYzine (ATARAX) 25 MG tablet Take 25 mg by mouth 3 (three) times daily as needed.    [provider]  naproxen (NAPROSYN) 500 MG tablet Take 1 tablet (500 mg total) by mouth 2 (two) times daily. 03/22/22   Renne Crigler, PA-C  ondansetron (ZOFRAN-ODT) 4 MG disintegrating tablet Take 1 tablet (4 mg total) by mouth every 8 (eight) hours as needed for nausea or vomiting. 03/22/22   Renne Crigler, PA-C      Allergies    Patient has no known allergies.    Review of Systems   Review of Systems  Physical Exam Updated Vital Signs BP (!) 134/101 (BP Location: Right Arm)   Pulse 81   Temp 98.3 F (36.8 C)   Resp 14   Wt 70.3 kg   LMP 10/16/2022 (Exact Date)   SpO2 96%   BMI 30.27 kg/m  Physical Exam Vitals and nursing note reviewed.  Constitutional:      General:  She is not in acute distress.    Appearance: She is well-developed.  HENT:     Head: Normocephalic and atraumatic.     Right Ear: External ear normal.     Left Ear: External ear normal.     Nose: Nose normal.  Eyes:     Extraocular Movements: Extraocular movements intact.     Conjunctiva/sclera: Conjunctivae normal.     Pupils: Pupils are equal, round, and reactive to light.  Cardiovascular:     Rate and Rhythm: Normal rate and regular rhythm.  Pulmonary:     Effort: Pulmonary effort is normal. No respiratory distress.  Abdominal:     General: Abdomen is flat. There is no distension.     Palpations: Abdomen is soft. There is no mass.     Tenderness: There is abdominal tenderness (Mild, diffuse). There is right CVA tenderness. There is no left CVA tenderness or guarding.  Musculoskeletal:     Cervical back: Normal range of motion and neck supple.     Right lower leg: No edema.     Left lower leg: No edema.  Skin:    General: Skin is warm and dry.  Neurological:     Mental Status: She is alert and oriented to person, place, and time. Mental status is at baseline.  Psychiatric:        Mood and  Affect: Mood normal.     ED Results / Procedures / Treatments   Labs (all labs ordered are listed, but only abnormal results are displayed) Labs Reviewed  URINALYSIS, ROUTINE W REFLEX MICROSCOPIC  PREGNANCY, URINE  CBC WITH DIFFERENTIAL/PLATELET  LIPASE, BLOOD  COMPREHENSIVE METABOLIC PANEL    EKG None  Radiology No results found.  Procedures Procedures  {Document cardiac monitor, telemetry assessment procedure when appropriate:1}  Medications Ordered in ED Medications  ondansetron (ZOFRAN) injection 4 mg (has no administration in time range)  morphine (PF) 4 MG/ML injection 4 mg (has no administration in time range)    ED Course/ Medical Decision Making/ A&P   {   Click here for ABCD2, HEART and other calculatorsREFRESH Note before signing :1}                               Medical Decision Making Amount and/or Complexity of Data Reviewed Labs: ordered. Radiology: ordered.  Risk Prescription drug management.   ***  {Document critical care time when appropriate:1} {Document review of labs and clinical decision tools ie heart score, Chads2Vasc2 etc:1}  {Document your independent review of radiology images, and any outside records:1} {Document your discussion with family members, caretakers, and with consultants:1} {Document social determinants of health affecting pt's care:1} {Document your decision making why or why not admission, treatments were needed:1} Final Clinical Impression(s) / ED Diagnoses Final diagnoses:  None    Rx / DC Orders ED Discharge Orders     None

## 2023-03-14 ENCOUNTER — Other Ambulatory Visit: Payer: Self-pay

## 2023-03-14 ENCOUNTER — Emergency Department (HOSPITAL_BASED_OUTPATIENT_CLINIC_OR_DEPARTMENT_OTHER)
Admission: EM | Admit: 2023-03-14 | Discharge: 2023-03-14 | Discharge status: Against Medical Advice | Payer: Self-pay | Attending: Emergency Medicine | Admitting: Emergency Medicine

## 2023-03-14 ENCOUNTER — Emergency Department (HOSPITAL_BASED_OUTPATIENT_CLINIC_OR_DEPARTMENT_OTHER): Payer: Self-pay

## 2023-03-14 DIAGNOSIS — Z87442 Personal history of urinary calculi: Secondary | ICD-10-CM | POA: Insufficient documentation

## 2023-03-14 DIAGNOSIS — Z5321 Procedure and treatment not carried out due to patient leaving prior to being seen by health care provider: Secondary | ICD-10-CM | POA: Insufficient documentation

## 2023-03-14 DIAGNOSIS — R109 Unspecified abdominal pain: Secondary | ICD-10-CM | POA: Insufficient documentation

## 2023-03-14 DIAGNOSIS — R300 Dysuria: Secondary | ICD-10-CM | POA: Insufficient documentation

## 2023-03-14 LAB — COMPREHENSIVE METABOLIC PANEL
ALT: 20 U/L (ref 0–44)
AST: 16 U/L (ref 15–41)
Albumin: 4.2 g/dL (ref 3.5–5.0)
Alkaline Phosphatase: 61 U/L (ref 38–126)
Anion gap: 8 (ref 5–15)
BUN: 16 mg/dL (ref 6–20)
CO2: 25 mmol/L (ref 22–32)
Calcium: 8.9 mg/dL (ref 8.9–10.3)
Chloride: 105 mmol/L (ref 98–111)
Creatinine, Ser: 0.8 mg/dL (ref 0.44–1.00)
GFR, Estimated: >60 mL/min (ref >60)
Glucose, Bld: 93 mg/dL (ref 70–99)
Potassium: 3.8 mmol/L (ref 3.5–5.1)
Sodium: 138 mmol/L (ref 135–145)
Total Bilirubin: 0.3 mg/dL (ref 0.0–1.2)
Total Protein: 6.7 g/dL (ref 6.5–8.1)

## 2023-03-14 LAB — CBC
HCT: 34.8 % — ABNORMAL LOW (ref 36.0–46.0)
Hemoglobin: 11.1 g/dL — ABNORMAL LOW (ref 12.0–15.0)
MCH: 28.8 pg (ref 26.0–34.0)
MCHC: 31.9 g/dL (ref 30.0–36.0)
MCV: 90.4 fL (ref 80.0–100.0)
Platelets: 292 10*3/uL (ref 150–400)
RBC: 3.85 MIL/uL — ABNORMAL LOW (ref 3.87–5.11)
RDW: 15.1 % (ref 11.5–15.5)
WBC: 10 10*3/uL (ref 4.0–10.5)
nRBC: 0 % (ref 0.0–0.2)

## 2023-03-14 LAB — URINALYSIS, ROUTINE W REFLEX MICROSCOPIC
Bacteria, UA: NONE SEEN
Bilirubin Urine: NEGATIVE
Glucose, UA: NEGATIVE mg/dL
Hgb urine dipstick: NEGATIVE
Ketones, ur: NEGATIVE mg/dL
Leukocytes,Ua: NEGATIVE
Nitrite: NEGATIVE
Protein, ur: NEGATIVE mg/dL
Specific Gravity, Urine: 1.023 (ref 1.005–1.030)
pH: 7.5 (ref 5.0–8.0)

## 2023-03-14 LAB — PREGNANCY, URINE: Preg Test, Ur: NEGATIVE

## 2023-03-14 LAB — LIPASE, BLOOD: Lipase: 21 U/L (ref 11–51)

## 2023-03-14 NOTE — ED Notes (Signed)
Pt called to be roomed x3 with no answer.

## 2023-03-14 NOTE — ED Triage Notes (Signed)
Bilateral flank pain several days. Some dysuria. HX kidney stones. Denies fevers, V/D, endorses N.

## 2023-04-01 DIAGNOSIS — F411 Generalized anxiety disorder: Secondary | ICD-10-CM | POA: Diagnosis not present

## 2023-04-01 DIAGNOSIS — J302 Other seasonal allergic rhinitis: Secondary | ICD-10-CM | POA: Diagnosis not present

## 2023-04-10 ENCOUNTER — Other Ambulatory Visit: Payer: Self-pay

## 2023-04-10 ENCOUNTER — Emergency Department (HOSPITAL_COMMUNITY)
Admission: EM | Admit: 2023-04-10 | Discharge: 2023-04-10 | Disposition: A | Discharge status: Home / Self Care | Payer: Self-pay | Attending: Emergency Medicine | Admitting: Emergency Medicine

## 2023-04-10 ENCOUNTER — Emergency Department (HOSPITAL_COMMUNITY): Payer: Self-pay

## 2023-04-10 DIAGNOSIS — J101 Influenza due to other identified influenza virus with other respiratory manifestations: Secondary | ICD-10-CM | POA: Insufficient documentation

## 2023-04-10 LAB — COMPREHENSIVE METABOLIC PANEL
ALT: 15 U/L (ref 0–44)
AST: 14 U/L — ABNORMAL LOW (ref 15–41)
Albumin: 4 g/dL (ref 3.5–5.0)
Alkaline Phosphatase: 58 U/L (ref 38–126)
Anion gap: 8 (ref 5–15)
BUN: 10 mg/dL (ref 6–20)
CO2: 23 mmol/L (ref 22–32)
Calcium: 9.1 mg/dL (ref 8.9–10.3)
Chloride: 105 mmol/L (ref 98–111)
Creatinine, Ser: 0.83 mg/dL (ref 0.44–1.00)
GFR, Estimated: >60 mL/min (ref >60)
Glucose, Bld: 92 mg/dL (ref 70–99)
Potassium: 3.7 mmol/L (ref 3.5–5.1)
Sodium: 136 mmol/L (ref 135–145)
Total Bilirubin: 0.5 mg/dL (ref 0.0–1.2)
Total Protein: 7.3 g/dL (ref 6.5–8.1)

## 2023-04-10 LAB — RESP PANEL BY RT-PCR (RSV, FLU A&B, COVID)  RVPGX2
Influenza A by PCR: POSITIVE — AB
Influenza B by PCR: NEGATIVE
Resp Syncytial Virus by PCR: NEGATIVE
SARS Coronavirus 2 by RT PCR: NEGATIVE

## 2023-04-10 LAB — URINALYSIS, ROUTINE W REFLEX MICROSCOPIC
Bilirubin Urine: NEGATIVE
Glucose, UA: NEGATIVE mg/dL
Hgb urine dipstick: NEGATIVE
Ketones, ur: NEGATIVE mg/dL
Leukocytes,Ua: NEGATIVE
Nitrite: NEGATIVE
Protein, ur: NEGATIVE mg/dL
Specific Gravity, Urine: 1.02 (ref 1.005–1.030)
pH: 8 (ref 5.0–8.0)

## 2023-04-10 LAB — CBC
HCT: 37.9 % (ref 36.0–46.0)
Hemoglobin: 11.8 g/dL — ABNORMAL LOW (ref 12.0–15.0)
MCH: 29.1 pg (ref 26.0–34.0)
MCHC: 31.1 g/dL (ref 30.0–36.0)
MCV: 93.6 fL (ref 80.0–100.0)
Platelets: 296 10*3/uL (ref 150–400)
RBC: 4.05 MIL/uL (ref 3.87–5.11)
RDW: 14.8 % (ref 11.5–15.5)
WBC: 7.2 10*3/uL (ref 4.0–10.5)
nRBC: 0 % (ref 0.0–0.2)

## 2023-04-10 LAB — TROPONIN I (HIGH SENSITIVITY): Troponin I (High Sensitivity): <2 ng/L (ref <18)

## 2023-04-10 MED ORDER — ACETAMINOPHEN 500 MG PO TABS
500.0000 mg | ORAL_TABLET | Freq: Four times a day (QID) | ORAL | 0 refills | Status: AC | PRN
Start: 2023-04-10 — End: ?

## 2023-04-10 MED ORDER — ACETAMINOPHEN 325 MG PO TABS
650.0000 mg | ORAL_TABLET | Freq: Once | ORAL | Status: DC | PRN
  Filled 2023-04-10: qty 2

## 2023-04-10 MED ORDER — BENZONATATE 100 MG PO CAPS: 100.0000 mg | ORAL_CAPSULE | Freq: Three times a day (TID) | ORAL | 0 refills | Status: AC

## 2023-04-10 MED ORDER — OSELTAMIVIR PHOSPHATE 75 MG PO CAPS
75.0000 mg | ORAL_CAPSULE | Freq: Two times a day (BID) | ORAL | 0 refills | Status: AC
Start: 2023-04-10 — End: ?

## 2023-04-10 NOTE — Discharge Instructions (Addendum)
You have test positive for influenza A.  Please take medications prescribed.  Your illness would likely last for 1 to 2 weeks.  Stay hydrated, drink plenty of fluid, alternate between Tylenol ibuprofen as needed as needed for aches and pain.  Take Tessalon as needed for cough.

## 2023-04-10 NOTE — ED Provider Notes (Signed)
[place] EMERGENCY DEPARTMENT AT [facility] Provider Note   CSN: [id number] Arrival date & time: 04/10/23  1618     History  Chief Complaint  Patient presents with   Generalized Body Aches   Shortness of Breath    Janice Fisher is a 28 y.o. female.  The history is provided by the patient and medical records. No language interpreter was used.  Shortness of Breath    28 year old female history of kidney stone presenting with cold symptoms.  Since last night patient has had fever, chills, body aches, congestion, cough.  She also mention having some cloudy urine for the past several weeks and has has history of kidney stones in the past.  She tries taking ibuprofen at home.  No other treatment.  No nausea vomiting or diarrhea.  Home Medications Prior to Admission medications   Medication Sig Start Date End Date Taking? Authorizing Provider  famotidine (PEPCID) 20 MG tablet Take 1 tablet (20 mg total) by mouth 2 (two) times daily. 10/20/21   Nira Conn, MD  hydrOXYzine (ATARAX) 25 MG tablet Take 25 mg by mouth 3 (three) times daily as needed.    [provider]  naproxen (NAPROSYN) 500 MG tablet Take 1 tablet (500 mg total) by mouth 2 (two) times daily. 03/22/22   Renne Crigler, PA-C  ondansetron (ZOFRAN-ODT) 4 MG disintegrating tablet Take 1 tablet (4 mg total) by mouth every 8 (eight) hours as needed for nausea or vomiting. 03/22/22   Renne Crigler, PA-C  oxyCODONE (ROXICODONE) 5 MG immediate release tablet Take 1 tablet (5 mg total) by mouth every 4 (four) hours as needed for severe pain. 11/04/22   Rondel Baton, MD      Allergies    Patient has no known allergies.    Review of Systems   Review of Systems  Respiratory:  Positive for shortness of breath.   All other systems reviewed and are negative.   Physical Exam Updated Vital Signs BP 127/83 (BP Location: Left Arm)   Pulse (!) 103   Temp 100.1 F (37.8 C) (Oral)   Resp 14   Ht  5' (1.524 m)   Wt 72.6 kg   LMP 03/27/2023 (Exact Date)   SpO2 100%   BMI 31.25 kg/m  Physical Exam Vitals and nursing note reviewed.  Constitutional:      General: She is not in acute distress.    Appearance: She is well-developed.  HENT:     Head: Atraumatic.  Eyes:     Conjunctiva/sclera: Conjunctivae normal.  Cardiovascular:     Rate and Rhythm: Normal rate and regular rhythm.     Pulses: Normal pulses.     Heart sounds: Normal heart sounds.  Pulmonary:     Effort: Pulmonary effort is normal.     Breath sounds: No wheezing, rhonchi or rales.  Abdominal:     Palpations: Abdomen is soft.     Tenderness: There is no right CVA tenderness or left CVA tenderness.  Musculoskeletal:     Cervical back: Normal range of motion and neck supple. No rigidity.  Skin:    Findings: No rash.  Neurological:     Mental Status: She is alert.  Psychiatric:        Mood and Affect: Mood normal.     ED Results / Procedures / Treatments   Labs (all labs ordered are listed, but only abnormal results are displayed) Labs Reviewed  RESP PANEL BY RT-PCR (RSV, FLU A&B, COVID)  RVPGX2 - Abnormal; Notable for the following components:      Result Value   Influenza A by PCR POSITIVE (*)    All other components within normal limits  CBC - Abnormal; Notable for the following components:   Hemoglobin 11.8 (*)    All other components within normal limits  COMPREHENSIVE METABOLIC PANEL - Abnormal; Notable for the following components:   AST 14 (*)    All other components within normal limits  URINALYSIS, ROUTINE W REFLEX MICROSCOPIC  TROPONIN I (HIGH SENSITIVITY)    EKG None  Date: 04/10/2023  Rate: 101  Rhythm: sinus tachycardia  QRS Axis: normal  Intervals: normal  ST/T Wave abnormalities: normal  Conduction Disutrbances: none  Narrative Interpretation:   Old EKG Reviewed: No significant changes noted    Radiology DG Chest 2 View Result Date: 04/10/2023 CLINICAL DATA:  Chest pain  EXAM: CHEST - 2 VIEW COMPARISON:  Renal stone CT 11/04/2022 FINDINGS: The heart size and mediastinal contours are within normal limits. Both lungs are clear. The visualized skeletal structures are unremarkable. IMPRESSION: No active cardiopulmonary disease. Electronically Signed   By: Annia Belt M.D.   On: 04/10/2023 17:15    Procedures Procedures    Medications Ordered in ED Medications  acetaminophen (TYLENOL) tablet 650 mg (has no administration in time range)    ED Course/ Medical Decision Making/ A&P                                 Medical Decision Making Amount and/or Complexity of Data Reviewed Labs: ordered. Radiology: ordered.  Risk OTC drugs.   BP 127/83 (BP Location: Left Arm)   Pulse (!) 103   Temp 100.1 F (37.8 C) (Oral)   Resp 14   Ht 5' (1.524 m)   Wt 72.6 kg   LMP 03/27/2023 (Exact Date)   SpO2 100%   BMI 31.25 kg/m   44:58 PM 28 year old female history of kidney stone presenting with cold symptoms.  Since last night patient has had fever, chills, body aches, congestion, cough.  She also mention having some cloudy urine for the past several weeks and has has history of kidney stones in the past.  She tries taking ibuprofen at home.  No other treatment.  No nausea vomiting or diarrhea.  Exam overall reassuring this is a well-appearing female in no acute discomfort.  Her lungs are clear to auscultation she has no CVA tenderness.  Patient was febrile, fever improved with medication given in the ER.  -Labs ordered, independently viewed and interpreted by me.  Labs remarkable for flu positive -The patient was maintained on a cardiac monitor.  I personally viewed and interpreted the cardiac monitored which showed an underlying rhythm of: sinus tachycardia -Imaging independently viewed and interpreted by me and I agree with radiologist's interpretation.  Result remarkable for cxr unremarkable -This patient presents to the ED for concern of flu sxs, this  involves an extensive number of treatment options, and is a complaint that carries with it a high risk of complications and morbidity.  The differential diagnosis includes covid, flu, rsv, pneumonia, viral illness, pyelonephritis, kidney stone -Co morbidities that complicate the patient evaluation includes kidney stone -Treatment includes tylenol -Reevaluation of the patient after these medicines showed that the patient improved -PCP office notes or outside notes reviewed -Escalation to admission/observation considered: patients feels much better, is comfortable with discharge, and will follow up with PCP -Prescription medication  considered, patient comfortable with tamiflu, tessalon, tylenol -Social Determinant of Health considered          Final Clinical Impression(s) / ED Diagnoses Final diagnoses:  Influenza A    Rx / DC Orders ED Discharge Orders          Ordered    oseltamivir (TAMIFLU) 75 MG capsule  Every 12 hours        04/10/23 1906    acetaminophen (TYLENOL) 500 MG tablet  Every 6 hours PRN        04/10/23 1906    benzonatate (TESSALON) 100 MG capsule  Every 8 hours        04/10/23 1906              Fayrene Helper, PA-C 04/10/23 1909    Lorre Nick, MD 04/10/23 2259

## 2023-04-10 NOTE — ED Triage Notes (Signed)
Pt arrived via POV. C/o gen body aches, cloudy urine, and shortness of breath for several days. Fever of 101 since last night  Hx kidney stones AOx4

## 2023-04-21 DIAGNOSIS — M25562 Pain in left knee: Secondary | ICD-10-CM | POA: Diagnosis not present

## 2023-04-23 ENCOUNTER — Encounter: Payer: Self-pay | Admitting: Family Medicine

## 2023-04-23 ENCOUNTER — Ambulatory Visit (INDEPENDENT_AMBULATORY_CARE_PROVIDER_SITE_OTHER): Payer: Self-pay | Admitting: Family Medicine

## 2023-04-23 VITALS — BP 128/80 | Ht 60.0 in | Wt 170.0 lb

## 2023-04-23 DIAGNOSIS — S83242A Other tear of medial meniscus, current injury, left knee, initial encounter: Secondary | ICD-10-CM

## 2023-04-23 MED ORDER — NAPROXEN 500 MG PO TABS: 500.0000 mg | ORAL_TABLET | Freq: Two times a day (BID) | ORAL | 0 refills | Status: AC | PRN
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
## 2023-05-07 ENCOUNTER — Ambulatory Visit
Admission: RE | Admit: 2023-05-07 | Discharge: 2023-05-07 | Disposition: A | Discharge status: Home / Self Care | Payer: BC Managed Care – PPO | Source: Ambulatory Visit | Attending: Family Medicine | Admitting: Family Medicine

## 2023-05-07 DIAGNOSIS — S83242A Other tear of medial meniscus, current injury, left knee, initial encounter: Secondary | ICD-10-CM

## 2023-05-07 DIAGNOSIS — M25562 Pain in left knee: Secondary | ICD-10-CM | POA: Diagnosis not present

## 2023-05-20 ENCOUNTER — Encounter: Payer: Self-pay | Admitting: Family Medicine

## 2023-07-10 IMAGING — CT CT RENAL STONE PROTOCOL
2 of 4 series · 15 of 46 positions shown, 17 images · non-contrast
Comparison: None.

CLINICAL DATA: Abdominal pain and emesis since [DATE] this a.m.



[Series 3: ap without · axial · non-contrast · 0.76mm/px · z∈[+829,+1264]mm · 12 of 99 slices shown, 14 images]
[im 6/99  soft-tissue]
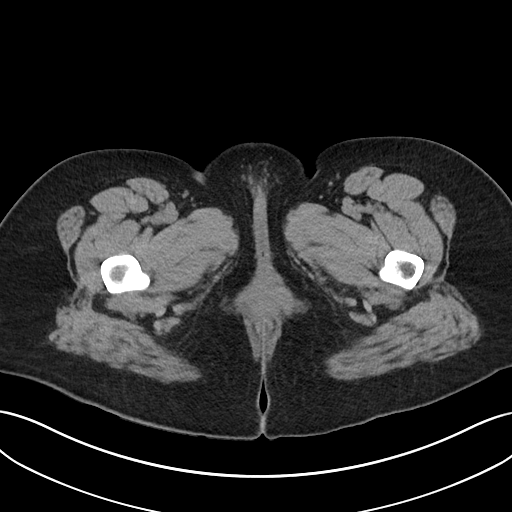
[im 6/99  bone]
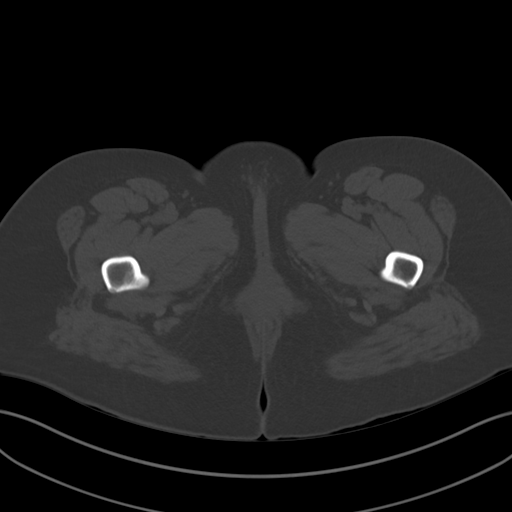
[im 17/99  soft-tissue]
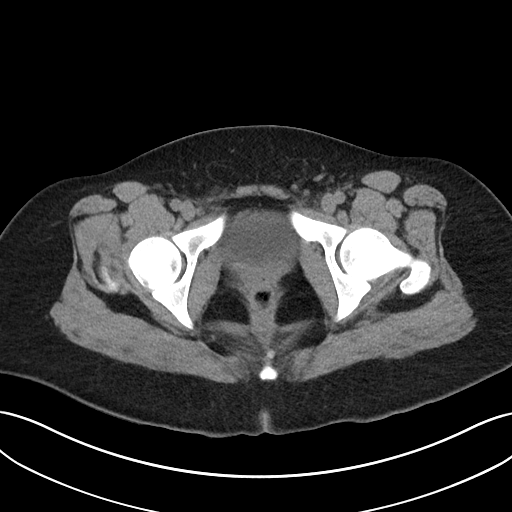
[im 22/99  soft-tissue]
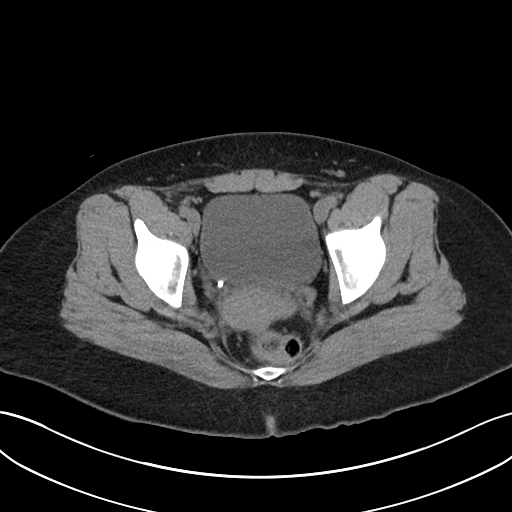
[im 28/99  soft-tissue]
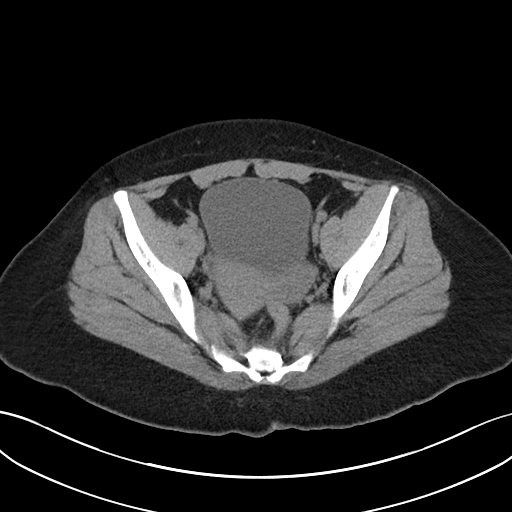
[im 39/99  soft-tissue]
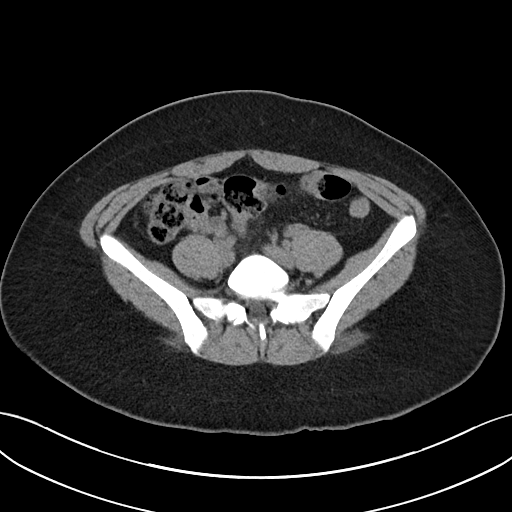
[im 44/99  soft-tissue]
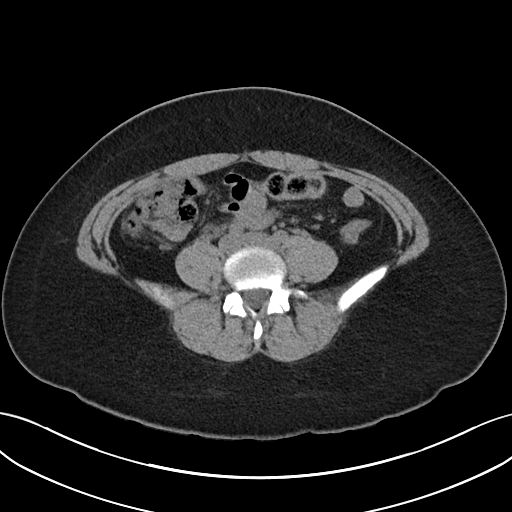
[im 55/99  soft-tissue]
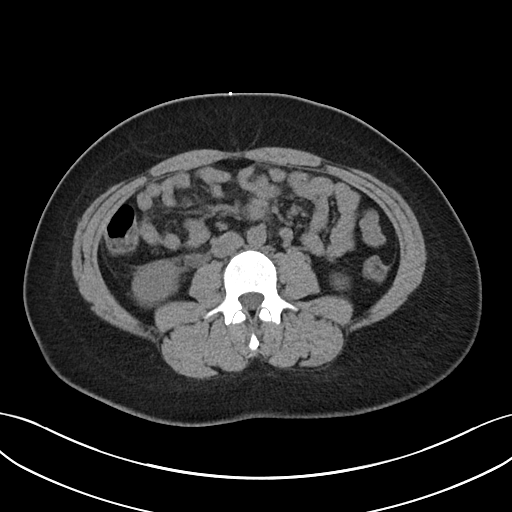
[im 60/99  soft-tissue]
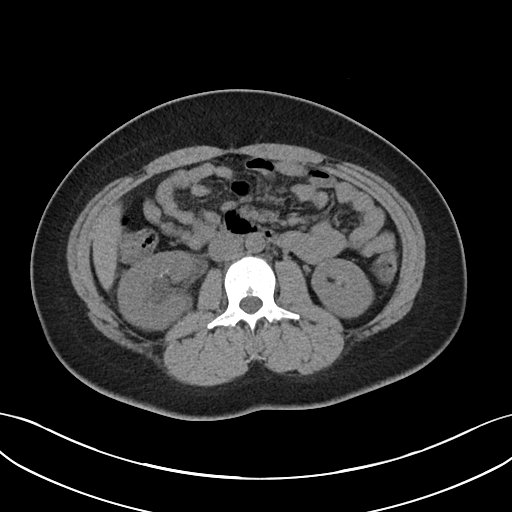
[im 71/99  soft-tissue]
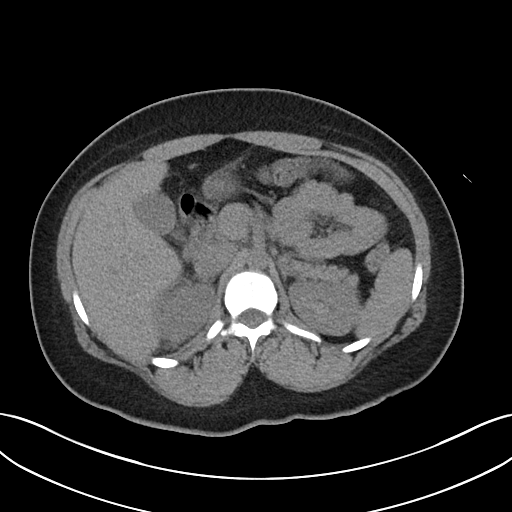
[im 71/99  bone]
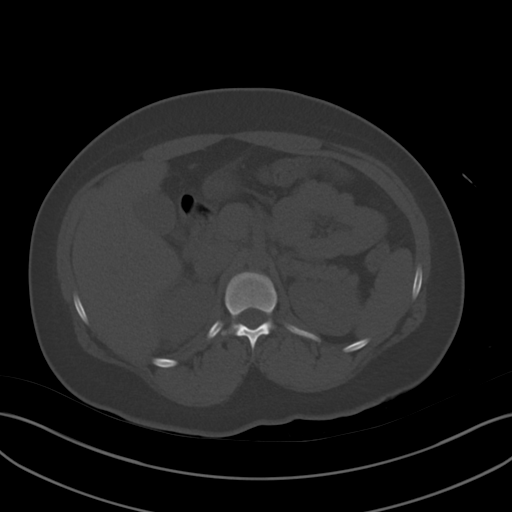
[im 77/99  soft-tissue]
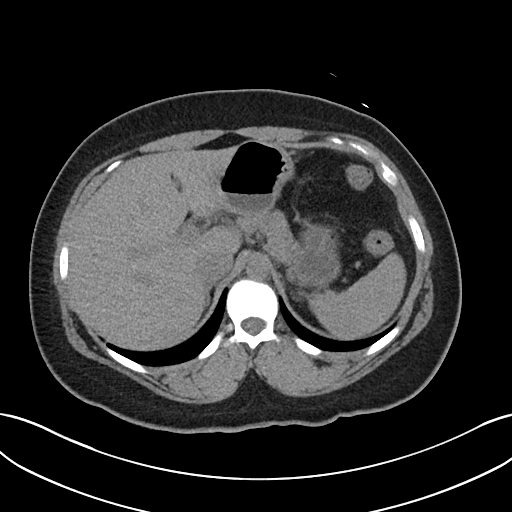
[im 82/99  soft-tissue]
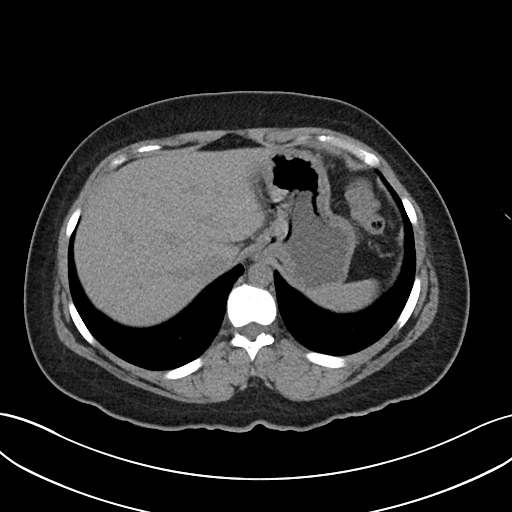
[im 93/99  soft-tissue]
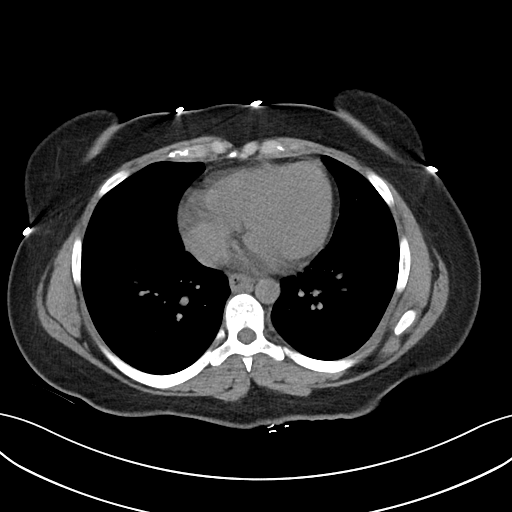

[Series 6: cor · coronal · 0.70mm/px · 3 of 95 slices shown]
[im 32/95  soft-tissue]
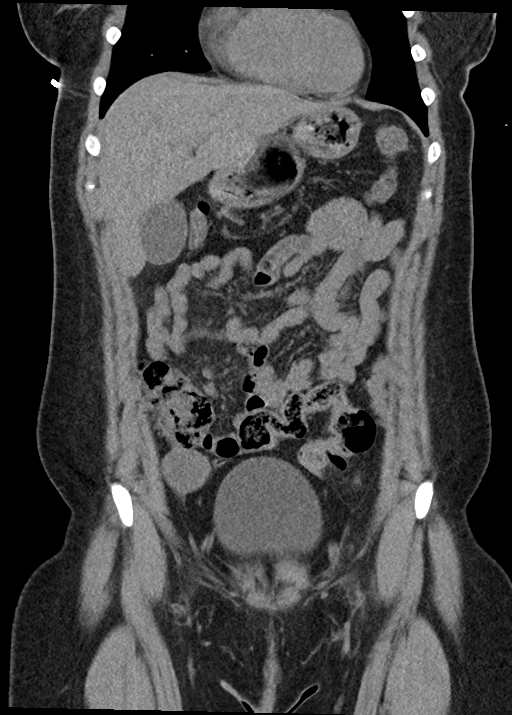
[im 42/95  soft-tissue]
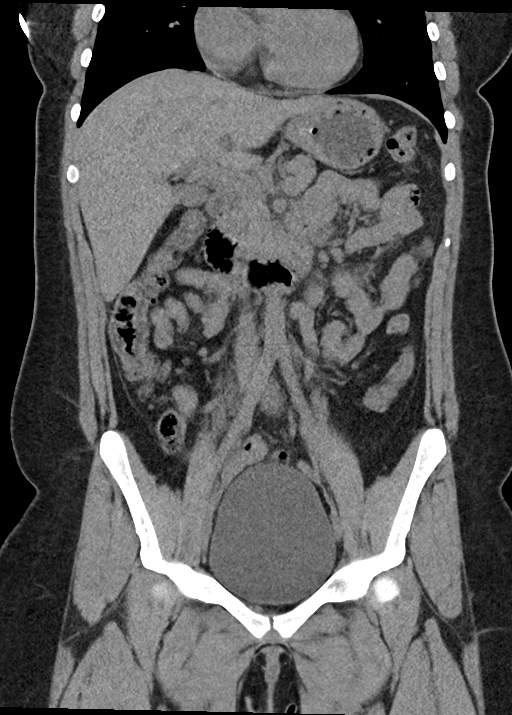
[im 53/95  soft-tissue]
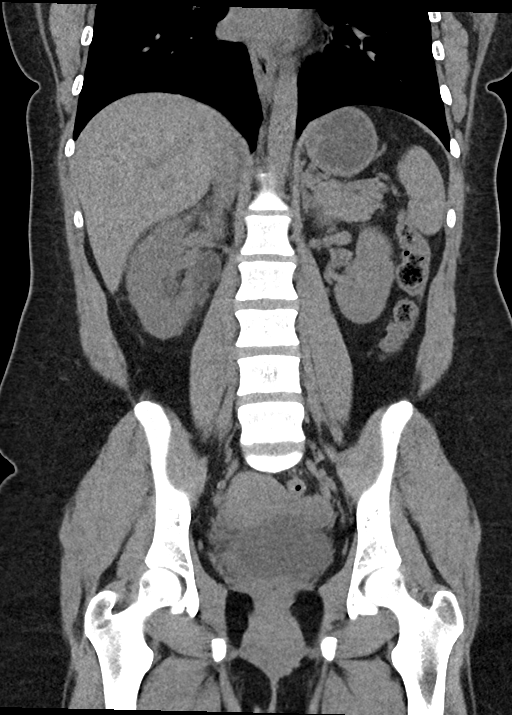

[15 of 46 positions shown; findings below may reference images not displayed]

FINDINGS: Lower chest: Patchy ground-glass opacities in the right middle lobe
for instance on images 7 & [DATE] are consistent with an infectious
or inflammatory etiology.

Hepatobiliary: Unremarkable noncontrast appearance of the hepatic
parenchyma. Gallbladder appears normal. No biliary ductal dilation.

Pancreas: No pancreatic ductal dilation or evidence of acute
inflammation.

Spleen: No splenomegaly or focal splenic lesion.

Adrenals/Urinary Tract: Bilateral adrenal glands appear normal.

Right perinephric and periureteric stranding with
hydroureteronephrosis to the level of a 5 mm stone at the
ureterovesicular junction on image 78/3.

Additional punctate 2 mm right lower pole nonobstructive renal
calculus.

Left kidney is unremarkable without hydronephrosis or renal
calculus.

Stomach/Bowel: No enteric contrast was administered. Stomach is
unremarkable for degree of distension. No pathologic dilation of
small or large bowel. Terminal ileum and appendix (coronal image
38/6) appear normal. No evidence of acute bowel inflammation.

Vascular/Lymphatic: Normal caliber abdominal aorta. No
pathologically enlarged abdominal or pelvic lymph nodes.

Reproductive: Uterus and left adnexa appear normal. 3.4 cm right
ovarian cyst.

Other: Trace pelvic free fluid, within physiologic normal limits.

Musculoskeletal: No acute or significant osseous findings.
IMPRESSION: 1. 5 mm obstructing stone at the right ureterovesicular junction
with associated right hydroureteronephrosis.
2. Additional punctate 2 mm right lower pole nonobstructive renal
calculus.
3. Patchy ground-glass opacities in the right middle lobe are
consistent with an infectious or inflammatory etiology.

## 2023-07-18 ENCOUNTER — Emergency Department (HOSPITAL_COMMUNITY): Admission: EM | Admit: 2023-07-18 | Discharge: 2023-07-18 | Discharge status: Against Medical Advice

## 2023-07-18 ENCOUNTER — Other Ambulatory Visit: Payer: Self-pay

## 2023-07-18 DIAGNOSIS — D72829 Elevated white blood cell count, unspecified: Secondary | ICD-10-CM | POA: Diagnosis not present

## 2023-07-18 DIAGNOSIS — R109 Unspecified abdominal pain: Secondary | ICD-10-CM

## 2023-07-18 DIAGNOSIS — Z5329 Procedure and treatment not carried out because of patient's decision for other reasons: Secondary | ICD-10-CM | POA: Insufficient documentation

## 2023-07-18 DIAGNOSIS — R1031 Right lower quadrant pain: Secondary | ICD-10-CM | POA: Diagnosis not present

## 2023-07-18 DIAGNOSIS — Z79899 Other long term (current) drug therapy: Secondary | ICD-10-CM | POA: Diagnosis not present

## 2023-07-18 DIAGNOSIS — I10 Essential (primary) hypertension: Secondary | ICD-10-CM | POA: Insufficient documentation

## 2023-07-18 DIAGNOSIS — Z8616 Personal history of COVID-19: Secondary | ICD-10-CM | POA: Insufficient documentation

## 2023-07-18 DIAGNOSIS — Z5321 Procedure and treatment not carried out due to patient leaving prior to being seen by health care provider: Secondary | ICD-10-CM

## 2023-07-18 LAB — COMPREHENSIVE METABOLIC PANEL WITH GFR
ALT: 15 U/L (ref 0–44)
AST: 17 U/L (ref 15–41)
Albumin: 3.8 g/dL (ref 3.5–5.0)
Alkaline Phosphatase: 59 U/L (ref 38–126)
Anion gap: 8 (ref 5–15)
BUN: 12 mg/dL (ref 6–20)
CO2: 26 mmol/L (ref 22–32)
Calcium: 9.5 mg/dL (ref 8.9–10.3)
Chloride: 108 mmol/L (ref 98–111)
Creatinine, Ser: 0.94 mg/dL (ref 0.44–1.00)
GFR, Estimated: >60 mL/min (ref >60)
Glucose, Bld: 89 mg/dL (ref 70–99)
Potassium: 3.6 mmol/L (ref 3.5–5.1)
Sodium: 142 mmol/L (ref 135–145)
Total Bilirubin: 0.3 mg/dL (ref 0.0–1.2)
Total Protein: 6.7 g/dL (ref 6.5–8.1)

## 2023-07-18 LAB — CBC
HCT: 38 % (ref 36.0–46.0)
Hemoglobin: 12.1 g/dL (ref 12.0–15.0)
MCH: 29.8 pg (ref 26.0–34.0)
MCHC: 31.8 g/dL (ref 30.0–36.0)
MCV: 93.6 fL (ref 80.0–100.0)
Platelets: 325 10*3/uL (ref 150–400)
RBC: 4.06 MIL/uL (ref 3.87–5.11)
RDW: 15 % (ref 11.5–15.5)
WBC: 12.4 10*3/uL — ABNORMAL HIGH (ref 4.0–10.5)
nRBC: 0 % (ref 0.0–0.2)

## 2023-07-18 LAB — URINALYSIS, ROUTINE W REFLEX MICROSCOPIC
Bilirubin Urine: NEGATIVE
Glucose, UA: NEGATIVE mg/dL
Hgb urine dipstick: NEGATIVE
Ketones, ur: NEGATIVE mg/dL
Leukocytes,Ua: NEGATIVE
Nitrite: NEGATIVE
Protein, ur: NEGATIVE mg/dL
Specific Gravity, Urine: 1.014 (ref 1.005–1.030)
pH: 5 (ref 5.0–8.0)

## 2023-07-18 LAB — HCG, SERUM, QUALITATIVE: Preg, Serum: NEGATIVE

## 2023-07-18 LAB — LIPASE, BLOOD: Lipase: 27 U/L (ref 11–51)

## 2023-07-18 IMAGING — US US RENAL
1 series · 14 of 25 positions shown · non-contrast
Comparison: CT abdomen pelvis dated April 17, 2021.

CLINICAL DATA: Kidney stone follow-up.

EXAM:
RENAL / URINARY TRACT ULTRASOUND COMPLETE

[Series 1: us renal · 14 of 29 slices shown]
[im 1/29]
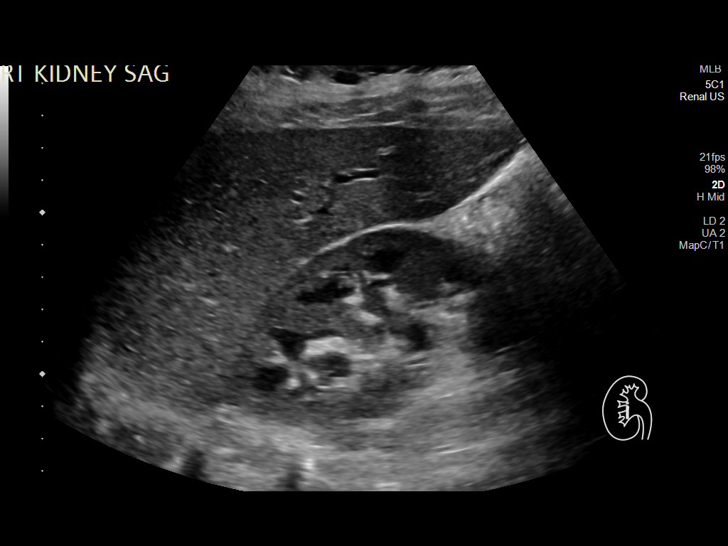
[im 3/29]
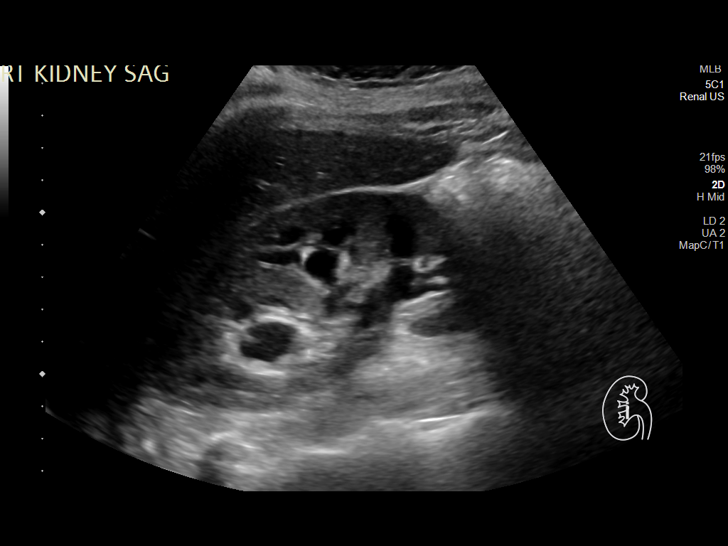
[im 5/29]
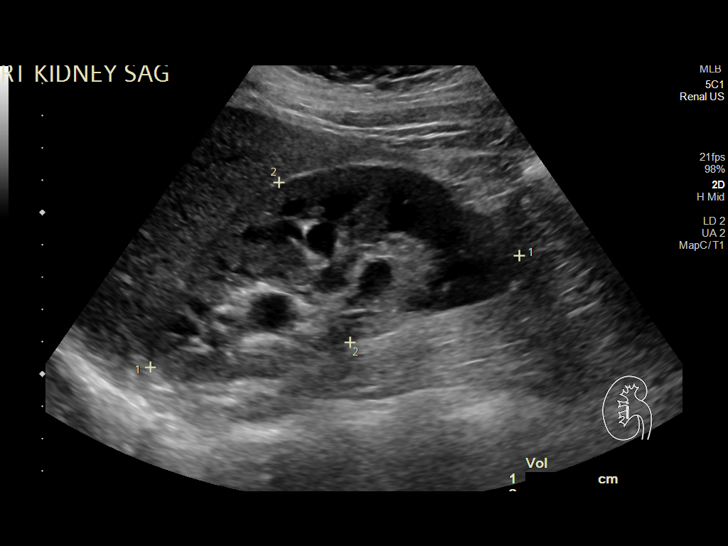
[im 8/29]
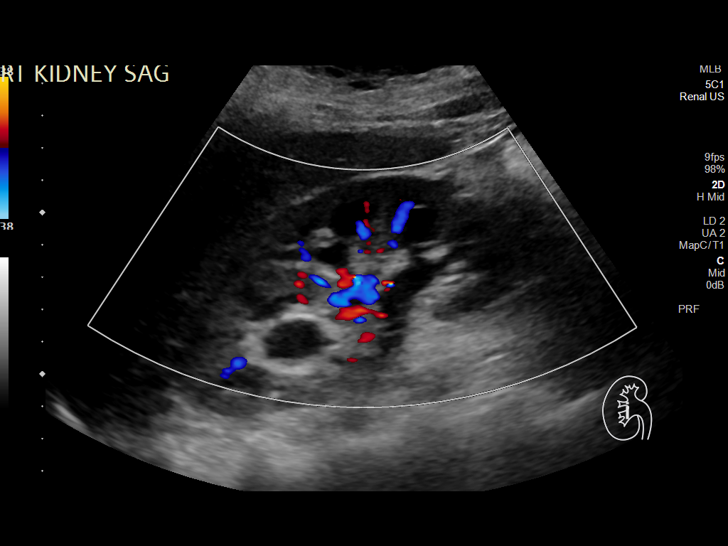
[im 10/29]
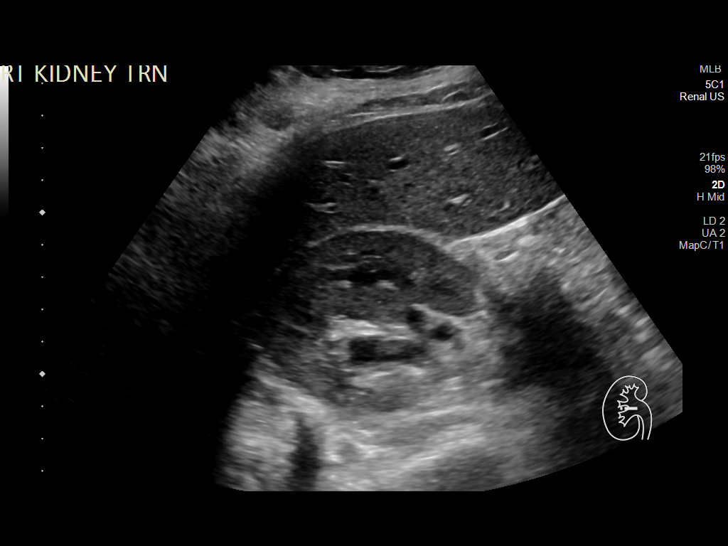
[im 11/29]
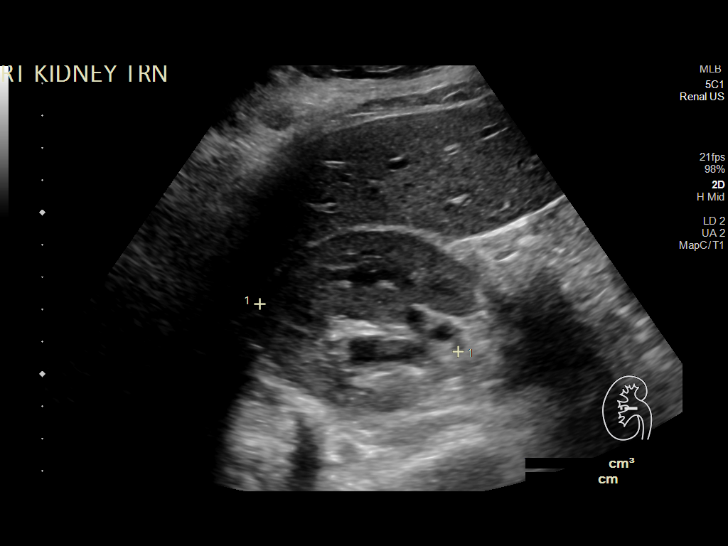
[im 13/29]
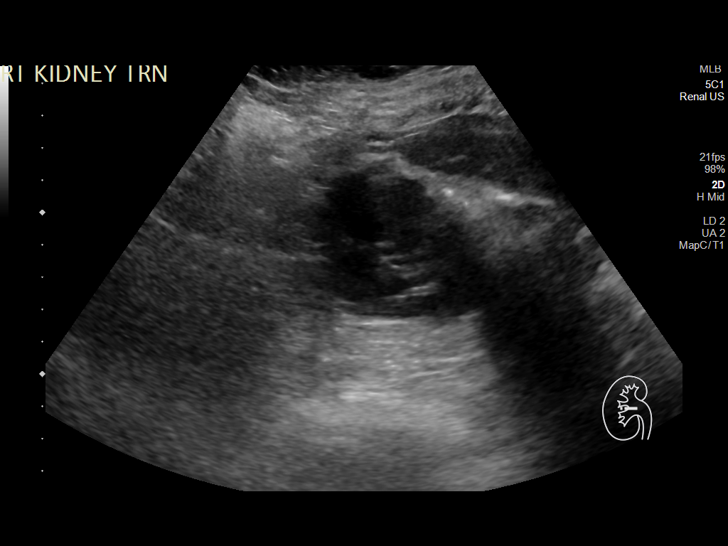
[im 16/29]
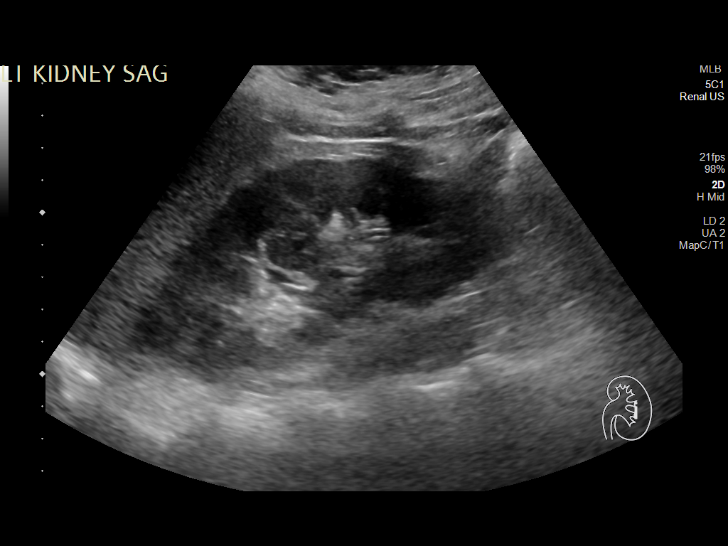
[im 18/29]
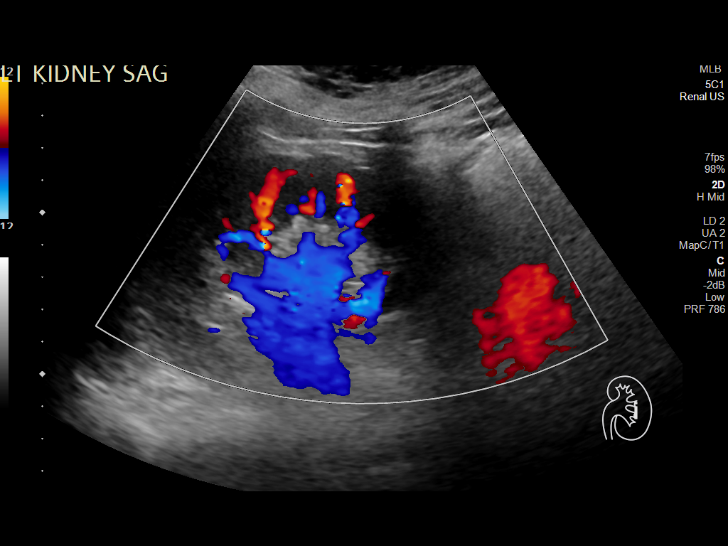
[im 19/29]
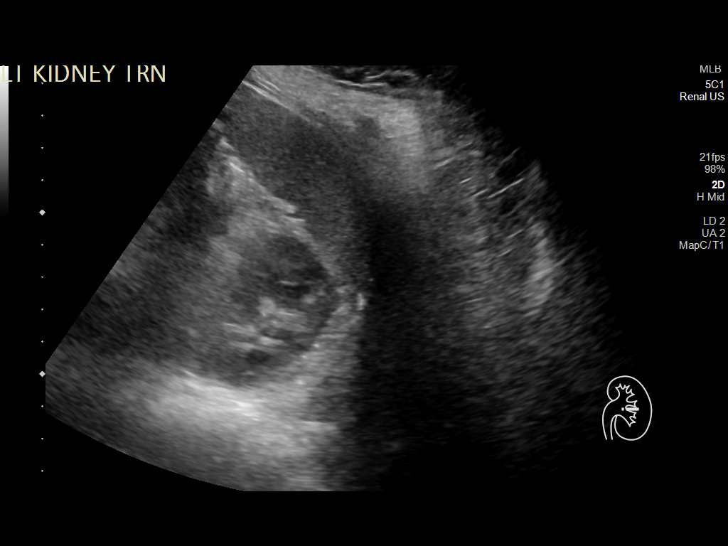
[im 22/29]
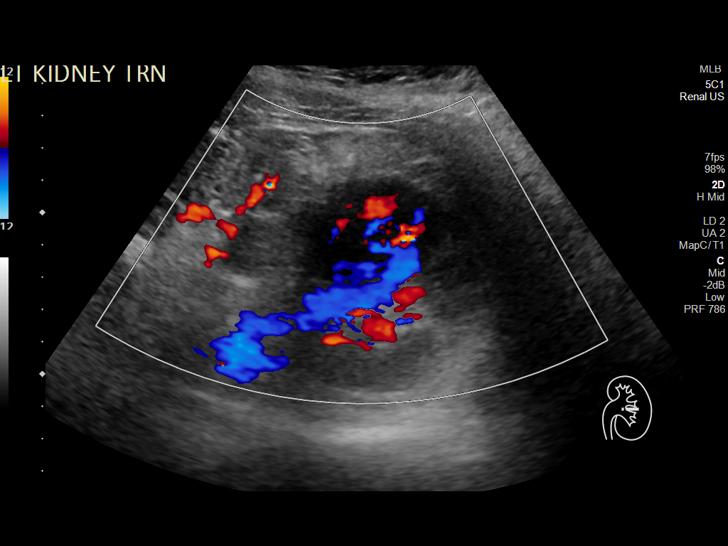
[im 24/29]
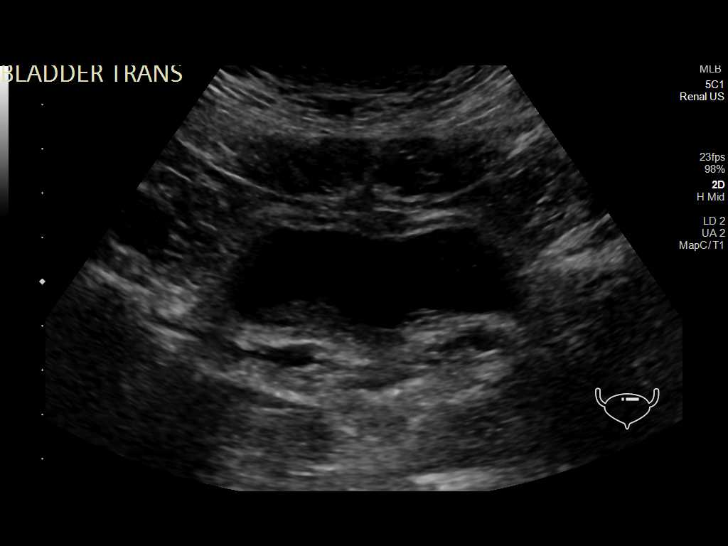
[im 26/29]
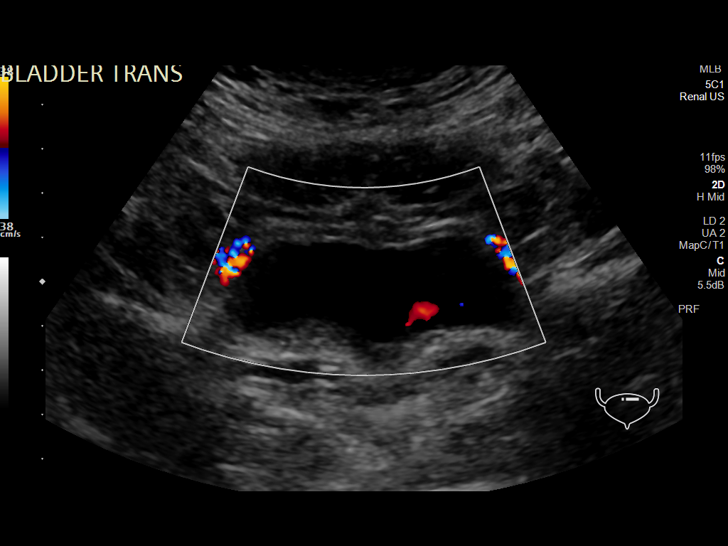
[im 29/29]
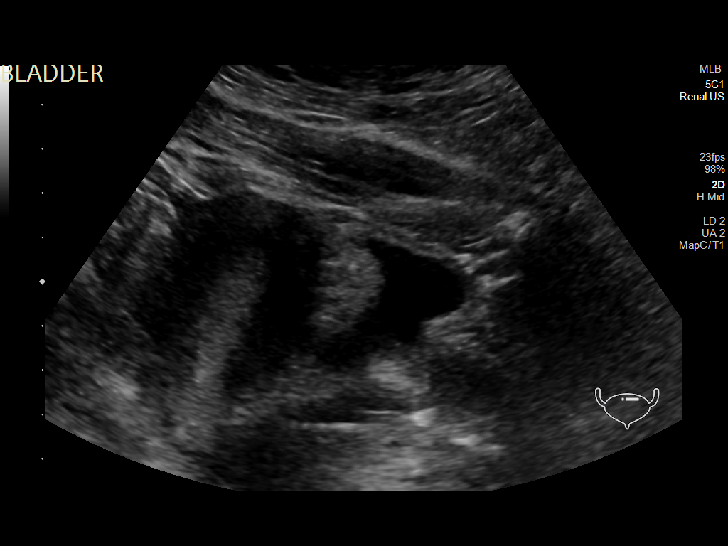

[14 of 25 positions shown; findings below may reference images not displayed]

FINDINGS: Right Kidney:

Renal measurements: 11.9 x 5.4 x 6.3 cm = volume: 214 mL.
Echogenicity within normal limits. Unchanged mild hydronephrosis. No
mass visualized.

Left Kidney:

Renal measurements: 12.2 x 5.5 x 5.0 cm = volume: 175 mL.
Echogenicity within normal limits. No mass or hydronephrosis
visualized.

Bladder:

Appears normal for degree of bladder distention.

Other:

None.
IMPRESSION: 1. Unchanged mild right hydronephrosis.

## 2023-07-18 NOTE — ED Notes (Signed)
 Pt eloped and left before evaluation. Ambulated to lobby with visitor, A&Ox4. EDP notified
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
## 2023-07-18 NOTE — ED Triage Notes (Signed)
 Patient reports RLQ pain with dysuria and malodorous cloudy urine for several weeks , occasional emesis .

## 2023-07-27 ENCOUNTER — Encounter (HOSPITAL_BASED_OUTPATIENT_CLINIC_OR_DEPARTMENT_OTHER): Payer: Self-pay

## 2023-07-27 ENCOUNTER — Other Ambulatory Visit: Payer: Self-pay

## 2023-07-27 ENCOUNTER — Emergency Department (HOSPITAL_BASED_OUTPATIENT_CLINIC_OR_DEPARTMENT_OTHER)
Admission: EM | Admit: 2023-07-27 | Discharge: 2023-07-27 | Disposition: A | Discharge status: Home / Self Care | Attending: Emergency Medicine | Admitting: Emergency Medicine

## 2023-07-27 ENCOUNTER — Emergency Department (HOSPITAL_BASED_OUTPATIENT_CLINIC_OR_DEPARTMENT_OTHER)

## 2023-07-27 DIAGNOSIS — R109 Unspecified abdominal pain: Secondary | ICD-10-CM | POA: Diagnosis not present

## 2023-07-27 DIAGNOSIS — N200 Calculus of kidney: Secondary | ICD-10-CM | POA: Diagnosis not present

## 2023-07-27 LAB — URINALYSIS, ROUTINE W REFLEX MICROSCOPIC
Bilirubin Urine: NEGATIVE
Glucose, UA: NEGATIVE mg/dL
Ketones, ur: NEGATIVE mg/dL
Nitrite: NEGATIVE
Specific Gravity, Urine: 1.028 (ref 1.005–1.030)
pH: 6 (ref 5.0–8.0)

## 2023-07-27 LAB — CBC
HCT: 35.5 % — ABNORMAL LOW (ref 36.0–46.0)
Hemoglobin: 11.4 g/dL — ABNORMAL LOW (ref 12.0–15.0)
MCH: 29.4 pg (ref 26.0–34.0)
MCHC: 32.1 g/dL (ref 30.0–36.0)
MCV: 91.5 fL (ref 80.0–100.0)
Platelets: 289 10*3/uL (ref 150–400)
RBC: 3.88 MIL/uL (ref 3.87–5.11)
RDW: 15.1 % (ref 11.5–15.5)
WBC: 9.6 10*3/uL (ref 4.0–10.5)
nRBC: 0 % (ref 0.0–0.2)

## 2023-07-27 LAB — BASIC METABOLIC PANEL WITH GFR
Anion gap: 11 (ref 5–15)
BUN: 14 mg/dL (ref 6–20)
CO2: 24 mmol/L (ref 22–32)
Calcium: 9.3 mg/dL (ref 8.9–10.3)
Chloride: 105 mmol/L (ref 98–111)
Creatinine, Ser: 0.85 mg/dL (ref 0.44–1.00)
GFR, Estimated: >60 mL/min (ref >60)
Glucose, Bld: 106 mg/dL — ABNORMAL HIGH (ref 70–99)
Potassium: 3.9 mmol/L (ref 3.5–5.1)
Sodium: 140 mmol/L (ref 135–145)

## 2023-07-27 LAB — PREGNANCY, URINE: Preg Test, Ur: NEGATIVE

## 2023-07-27 MED ORDER — OXYCODONE-ACETAMINOPHEN 5-325 MG PO TABS: 1.0000 | ORAL_TABLET | Freq: Four times a day (QID) | ORAL | 0 refills | Status: AC | PRN

## 2023-07-27 MED ORDER — KETOROLAC TROMETHAMINE 30 MG/ML IJ SOLN
30.0000 mg | Freq: Once | INTRAMUSCULAR | Status: AC
Start: 2023-07-27 — End: 2023-07-27
  Administered 2023-07-27: 30 mg via INTRAVENOUS
  Filled 2023-07-27: qty 1

## 2023-07-27 MED ORDER — MORPHINE SULFATE (PF) 4 MG/ML IV SOLN
4.0000 mg | Freq: Once | INTRAVENOUS | Status: AC
  Administered 2023-07-27: 4 mg via INTRAVENOUS
  Filled 2023-07-27: qty 1

## 2023-07-27 MED ORDER — ONDANSETRON HCL 4 MG/2ML IJ SOLN
4.0000 mg | Freq: Once | INTRAMUSCULAR | Status: AC
  Administered 2023-07-27: 4 mg via INTRAVENOUS
  Filled 2023-07-27: qty 2

## 2023-07-27 NOTE — ED Triage Notes (Signed)
 Pt reports sharp left sided flank pain beginning 1 hr ago. Hx of kidney stone on right side, feels same per patient. Associated nausea. Denies vomiting/diarrhea/fever. Reports frequent urination.

## 2023-07-27 NOTE — ED Provider Notes (Signed)
 [place] EMERGENCY DEPARTMENT AT [facility] Provider Note   CSN: [id number] Arrival date & time: 07/27/23  0054     History  Chief Complaint  Patient presents with   Flank Pain    Janice Fisher is a 28 y.o. female.  Patient is a 28 year old female with history of kidney stones.  Patient presenting today with complaints of left flank pain.  This started earlier today.  She denies any blood in her urine.  No fevers or chills.  No aggravating or alleviating factors.  This feels like what she felt with prior kidney stones.       Home Medications Prior to Admission medications   Medication Sig Start Date End Date Taking? Authorizing Provider  acetaminophen  (TYLENOL ) 500 MG tablet Take 1 tablet (500 mg total) by mouth every 6 (six) hours as needed. 04/10/23   Debbra Fairy, PA-C  benzonatate  (TESSALON ) 100 MG capsule Take 1 capsule (100 mg total) by mouth every 8 (eight) hours. 04/10/23   Debbra Fairy, PA-C  famotidine  (PEPCID ) 20 MG tablet Take 1 tablet (20 mg total) by mouth 2 (two) times daily. 10/20/21   Lindle Rhea, MD  hydrOXYzine (ATARAX) 25 MG tablet Take 25 mg by mouth 3 (three) times daily as needed.    [provider]  naproxen  (NAPROSYN ) 500 MG tablet Take 1 tablet (500 mg total) by mouth 2 (two) times daily as needed (knee pain). 04/23/23   Marvel Slicker C, DO  ondansetron  (ZOFRAN -ODT) 4 MG disintegrating tablet Take 1 tablet (4 mg total) by mouth every 8 (eight) hours as needed for nausea or vomiting. 03/22/22   Lyna Sandhoff, PA-C  oseltamivir  (TAMIFLU ) 75 MG capsule Take 1 capsule (75 mg total) by mouth every 12 (twelve) hours. 04/10/23   Debbra Fairy, PA-C  oxyCODONE  (ROXICODONE ) 5 MG immediate release tablet Take 1 tablet (5 mg total) by mouth every 4 (four) hours as needed for severe pain. 11/04/22   Ninetta Basket, MD      Allergies    Patient has no known allergies.    Review of Systems   Review of Systems  All other systems reviewed and  are negative.   Physical Exam Updated Vital Signs BP (!) 130/93 (BP Location: Right Arm)   Pulse 73   Temp 98.2 F (36.8 C)   Resp 20   Ht 5' (1.524 m)   Wt 77.1 kg   SpO2 98%   BMI 33.20 kg/m  Physical Exam Vitals and nursing note reviewed.  Constitutional:      General: She is not in acute distress.    Appearance: She is well-developed. She is not diaphoretic.  HENT:     Head: Normocephalic and atraumatic.  Cardiovascular:     Rate and Rhythm: Normal rate and regular rhythm.     Heart sounds: No murmur heard.    No friction rub. No gallop.  Pulmonary:     Effort: Pulmonary effort is normal. No respiratory distress.     Breath sounds: Normal breath sounds. No wheezing.  Abdominal:     General: Bowel sounds are normal. There is no distension.     Palpations: Abdomen is soft.     Tenderness: There is no abdominal tenderness. There is left CVA tenderness. There is no right CVA tenderness.  Musculoskeletal:        General: Normal range of motion.     Cervical back: Normal range of motion and neck supple.  Skin:    General: Skin is  warm and dry.  Neurological:     General: No focal deficit present.     Mental Status: She is alert and oriented to person, place, and time.     ED Results / Procedures / Treatments   Labs (all labs ordered are listed, but only abnormal results are displayed) Labs Reviewed  URINALYSIS, ROUTINE W REFLEX MICROSCOPIC - Abnormal; Notable for the following components:      Result Value   APPearance HAZY (*)    Hgb urine dipstick MODERATE (*)    Protein, ur TRACE (*)    Leukocytes,Ua TRACE (*)    Bacteria, UA RARE (*)    Crystals PRESENT (*)    All other components within normal limits  BASIC METABOLIC PANEL WITH GFR - Abnormal; Notable for the following components:   Glucose, Bld 106 (*)    All other components within normal limits  CBC - Abnormal; Notable for the following components:   Hemoglobin 11.4 (*)    HCT 35.5 (*)    All other  components within normal limits  PREGNANCY, URINE    EKG None  Radiology No results found.  Procedures Procedures    Medications Ordered in ED Medications  morphine  (PF) 4 MG/ML injection 4 mg (has no administration in time range)  ketorolac  (TORADOL ) 30 MG/ML injection 30 mg (has no administration in time range)  ondansetron  (ZOFRAN ) injection 4 mg (has no administration in time range)    ED Course/ Medical Decision Making/ A&P  Patient presenting with complaints of left flank pain as described in the HPI.  She arrives with stable vital signs and is afebrile.  Physical examination reveals mild left-sided CVA tenderness, but abdomen is benign.  Laboratory studies obtained including CBC, metabolic panel, urinalysis, and pregnancy test.  She does have microscopic hematuria, but the remainder of her laboratory studies and urinalysis are unremarkable.  CT scan with renal protocol showing a punctate calculus at the left UVJ.  Patient has been given Toradol  and morphine  for pain and Zofran  for nausea.  She will be discharged with Percocet for pain.  She has to follow-up with urology if not improving in the next few days.  Final Clinical Impression(s) / ED Diagnoses Final diagnoses:  None    Rx / DC Orders ED Discharge Orders     None         Orvilla Blander, MD 07/27/23 [phone]

## 2023-07-27 NOTE — Discharge Instructions (Signed)
 Begin taking Percocet as prescribed as needed for pain.  Follow-up with urology if you are not improving in the next week.  The contact information for [facility] has been provided in this discharge summary for you to call and make these arrangements.

## 2023-08-12 DIAGNOSIS — Z111 Encounter for screening for respiratory tuberculosis: Secondary | ICD-10-CM | POA: Diagnosis not present

## 2023-11-25 ENCOUNTER — Emergency Department (HOSPITAL_BASED_OUTPATIENT_CLINIC_OR_DEPARTMENT_OTHER)
Admission: EM | Admit: 2023-11-25 | Discharge: 2023-11-25 | Disposition: A | Discharge status: Home / Self Care | Payer: Self-pay | Attending: Emergency Medicine | Admitting: Emergency Medicine

## 2023-11-25 ENCOUNTER — Emergency Department (HOSPITAL_BASED_OUTPATIENT_CLINIC_OR_DEPARTMENT_OTHER): Payer: Self-pay

## 2023-11-25 ENCOUNTER — Encounter (HOSPITAL_BASED_OUTPATIENT_CLINIC_OR_DEPARTMENT_OTHER): Payer: Self-pay

## 2023-11-25 ENCOUNTER — Other Ambulatory Visit: Payer: Self-pay

## 2023-11-25 DIAGNOSIS — R112 Nausea with vomiting, unspecified: Secondary | ICD-10-CM | POA: Insufficient documentation

## 2023-11-25 DIAGNOSIS — R10A1 Flank pain, right side: Secondary | ICD-10-CM

## 2023-11-25 DIAGNOSIS — R109 Unspecified abdominal pain: Secondary | ICD-10-CM | POA: Insufficient documentation

## 2023-11-25 DIAGNOSIS — M545 Low back pain, unspecified: Secondary | ICD-10-CM | POA: Insufficient documentation

## 2023-11-25 LAB — BASIC METABOLIC PANEL WITH GFR
Anion gap: 12 (ref 5–15)
BUN: 14 mg/dL (ref 6–20)
CO2: 26 mmol/L (ref 22–32)
Calcium: 9.8 mg/dL (ref 8.9–10.3)
Chloride: 102 mmol/L (ref 98–111)
Creatinine, Ser: 0.89 mg/dL (ref 0.44–1.00)
GFR, Estimated: >60 mL/min (ref >60)
Glucose, Bld: 116 mg/dL — ABNORMAL HIGH (ref 70–99)
Potassium: 4.1 mmol/L (ref 3.5–5.1)
Sodium: 140 mmol/L (ref 135–145)

## 2023-11-25 LAB — URINALYSIS, ROUTINE W REFLEX MICROSCOPIC
Bacteria, UA: NONE SEEN
Bilirubin Urine: NEGATIVE
Glucose, UA: NEGATIVE mg/dL
Hgb urine dipstick: NEGATIVE
Ketones, ur: NEGATIVE mg/dL
Leukocytes,Ua: NEGATIVE
Nitrite: NEGATIVE
Protein, ur: NEGATIVE mg/dL
Specific Gravity, Urine: 1.026 (ref 1.005–1.030)
pH: 6 (ref 5.0–8.0)

## 2023-11-25 LAB — CBC
HCT: 38 % (ref 36.0–46.0)
Hemoglobin: 11.9 g/dL — ABNORMAL LOW (ref 12.0–15.0)
MCH: 29.2 pg (ref 26.0–34.0)
MCHC: 31.3 g/dL (ref 30.0–36.0)
MCV: 93.1 fL (ref 80.0–100.0)
Platelets: 342 K/uL (ref 150–400)
RBC: 4.08 MIL/uL (ref 3.87–5.11)
RDW: 14 % (ref 11.5–15.5)
WBC: 10.1 K/uL (ref 4.0–10.5)
nRBC: 0 % (ref 0.0–0.2)

## 2023-11-25 LAB — PREGNANCY, URINE: Preg Test, Ur: NEGATIVE

## 2023-11-25 MED ORDER — ONDANSETRON HCL 4 MG/2ML IJ SOLN
4.0000 mg | Freq: Once | INTRAMUSCULAR | Status: AC
  Administered 2023-11-25: 4 mg via INTRAVENOUS
  Filled 2023-11-25: qty 2

## 2023-11-25 MED ORDER — KETOROLAC TROMETHAMINE 30 MG/ML IJ SOLN
30.0000 mg | Freq: Once | INTRAMUSCULAR | Status: AC
Start: 2023-11-25 — End: 2023-11-25
  Administered 2023-11-25: 30 mg via INTRAVENOUS
  Filled 2023-11-25: qty 1

## 2023-11-25 NOTE — ED Notes (Signed)

## 2023-11-25 NOTE — ED Triage Notes (Signed)
 Hx of kidney stones. R flank pain starting yesterday. Woke her up from sleep this morning. Vomited when trying to take oxycodone  from last kidney stone.

## 2023-11-25 NOTE — ED Provider Notes (Signed)
 [place] EMERGENCY DEPARTMENT AT [facility] Provider Note   CSN: [id number] Arrival date & time: 11/25/23  9443     Patient presents with: Flank Pain   Janice Fisher is a 28 y.o. female.   Presents to the emergency department for evaluation of right flank pain.  Patient reports that she started having some pain in the right lower back yesterday.  Overnight she was awakened by severe flank pain, nausea and vomiting.  Symptoms similar to prior kidney stone.       Prior to Admission medications   Medication Sig Start Date End Date Taking? Authorizing Provider  acetaminophen  (TYLENOL ) 500 MG tablet Take 1 tablet (500 mg total) by mouth every 6 (six) hours as needed. 04/10/23   Nivia Colon, PA-C  benzonatate  (TESSALON ) 100 MG capsule Take 1 capsule (100 mg total) by mouth every 8 (eight) hours. 04/10/23   Nivia Colon, PA-C  famotidine  (PEPCID ) 20 MG tablet Take 1 tablet (20 mg total) by mouth 2 (two) times daily. 10/20/21   Trine Raynell Moder, MD  hydrOXYzine (ATARAX) 25 MG tablet Take 25 mg by mouth 3 (three) times daily as needed.    [provider]  naproxen  (NAPROSYN ) 500 MG tablet Take 1 tablet (500 mg total) by mouth 2 (two) times daily as needed (knee pain). 04/23/23   Teressa Rainell BROCKS, DO  ondansetron  (ZOFRAN -ODT) 4 MG disintegrating tablet Take 1 tablet (4 mg total) by mouth every 8 (eight) hours as needed for nausea or vomiting. 03/22/22   Desiderio Chew, PA-C  oseltamivir  (TAMIFLU ) 75 MG capsule Take 1 capsule (75 mg total) by mouth every 12 (twelve) hours. 04/10/23   Nivia Colon, PA-C  oxyCODONE  (ROXICODONE ) 5 MG immediate release tablet Take 1 tablet (5 mg total) by mouth every 4 (four) hours as needed for severe pain. 11/04/22   Yolande Lamar BROCKS, MD  oxyCODONE -acetaminophen  (PERCOCET) 5-325 MG tablet Take 1-2 tablets by mouth every 6 (six) hours as needed. 07/27/23   Geroldine Berg, MD    Allergies: Patient has no known allergies.    Review of  Systems  Updated Vital Signs BP (!) 133/99 (BP Location: Right Arm)   Pulse 90   Temp 97.7 F (36.5 C) (Oral)   Resp 18   LMP 11/06/2023 (Exact Date)   SpO2 100%   Physical Exam Vitals and nursing note reviewed.  Constitutional:      General: She is not in acute distress.    Appearance: She is well-developed.  HENT:     Head: Normocephalic and atraumatic.     Mouth/Throat:     Mouth: Mucous membranes are moist.  Eyes:     General: Vision grossly intact. Gaze aligned appropriately.     Extraocular Movements: Extraocular movements intact.     Conjunctiva/sclera: Conjunctivae normal.  Cardiovascular:     Rate and Rhythm: Normal rate and regular rhythm.     Pulses: Normal pulses.     Heart sounds: Normal heart sounds, S1 normal and S2 normal. No murmur heard.    No friction rub. No gallop.  Pulmonary:     Effort: Pulmonary effort is normal. No respiratory distress.     Breath sounds: Normal breath sounds.  Abdominal:     General: Bowel sounds are normal.     Palpations: Abdomen is soft.     Tenderness: There is no abdominal tenderness. There is right CVA tenderness. There is no guarding or rebound.     Hernia: No hernia is present.  Musculoskeletal:  General: No swelling.     Cervical back: Full passive range of motion without pain, normal range of motion and neck supple. No spinous process tenderness or muscular tenderness. Normal range of motion.     Right lower leg: No edema.     Left lower leg: No edema.  Skin:    General: Skin is warm and dry.     Capillary Refill: Capillary refill takes less than 2 seconds.     Findings: No ecchymosis, erythema, rash or wound.  Neurological:     General: No focal deficit present.     Mental Status: She is alert and oriented to person, place, and time.     GCS: GCS eye subscore is 4. GCS verbal subscore is 5. GCS motor subscore is 6.     Cranial Nerves: Cranial nerves 2-12 are intact.     Sensory: Sensation is intact.      Motor: Motor function is intact.     Coordination: Coordination is intact.  Psychiatric:        Attention and Perception: Attention normal.        Mood and Affect: Mood normal.        Speech: Speech normal.        Behavior: Behavior normal.     (all labs ordered are listed, but only abnormal results are displayed) Labs Reviewed  URINALYSIS, ROUTINE W REFLEX MICROSCOPIC - Abnormal; Notable for the following components:      Result Value   APPearance HAZY (*)    All other components within normal limits  CBC - Abnormal; Notable for the following components:   Hemoglobin 11.9 (*)    All other components within normal limits  PREGNANCY, URINE  BASIC METABOLIC PANEL WITH GFR    EKG: None  Radiology: No results found.   Procedures   Medications Ordered in the ED  ketorolac  (TORADOL ) 30 MG/ML injection 30 mg (30 mg Intravenous Given 11/25/23 0611)  ondansetron  (ZOFRAN ) injection 4 mg (4 mg Intravenous Given 11/25/23 0612)                                    Medical Decision Making Amount and/or Complexity of Data Reviewed Labs: ordered. Radiology: ordered.  Risk Prescription drug management.   Differential Diagnosis considered includes, but not limited to: Renal colic/kidney stone; pyelonephritis; aortic dissection; musculoskeletal pain.  Patient presents to the emergency department for evaluation of right flank pain.  Patient reports a previous history of kidney stones.  Patient reports mild pain during the day but severe pain that awakened her from sleep tonight.  She has had nausea and vomiting associated with the symptoms.  Fairly benign abdominal exam, some tenderness in the right lower back.  Urinalysis does not suggest infection.  No significant hematuria.  Will perform CT scan to further evaluate for ureterolithiasis.  Will signout oncoming ER physician to follow results of the imaging.     Final diagnoses:  Flank pain    ED Discharge Orders     None           Haze Lonni PARAS, MD 11/25/23 [phone]

## 2023-11-25 NOTE — ED Provider Notes (Signed)
 Care transferred to me.  CT is overall unremarkable.  No ureteral calculus.  No appendicitis.  I personally viewed/interpreted the images and agree with radiology.  Discussed OTC meds and treatment for what is probably muscular back pain.  Will discharge with return precautions.   Freddi Hamilton, MD 11/25/23 [phone]

## 2023-11-25 NOTE — Discharge Instructions (Signed)
 Your CT scan did not show any kidney stones in your ureter that would be blocking and causing pain.  This is probably from a muscle pain.  You may use topical therapies such as heat as well as NSAIDs and Tylenol .  Return to the ER for fever, vomiting, new or worsening pain, or any other new/concerning symptoms.

## 2024-01-11 ENCOUNTER — Emergency Department (HOSPITAL_BASED_OUTPATIENT_CLINIC_OR_DEPARTMENT_OTHER)

## 2024-01-11 ENCOUNTER — Other Ambulatory Visit: Payer: Self-pay

## 2024-01-11 ENCOUNTER — Encounter (HOSPITAL_BASED_OUTPATIENT_CLINIC_OR_DEPARTMENT_OTHER): Payer: Self-pay

## 2024-01-11 ENCOUNTER — Emergency Department (HOSPITAL_BASED_OUTPATIENT_CLINIC_OR_DEPARTMENT_OTHER)
Admission: EM | Admit: 2024-01-11 | Discharge: 2024-01-11 | Disposition: A | Discharge status: Home / Self Care | Attending: Emergency Medicine | Admitting: Emergency Medicine

## 2024-01-11 DIAGNOSIS — R109 Unspecified abdominal pain: Secondary | ICD-10-CM | POA: Diagnosis present

## 2024-01-11 DIAGNOSIS — R10A1 Flank pain, right side: Secondary | ICD-10-CM

## 2024-01-11 DIAGNOSIS — Z79899 Other long term (current) drug therapy: Secondary | ICD-10-CM | POA: Diagnosis not present

## 2024-01-11 DIAGNOSIS — D649 Anemia, unspecified: Secondary | ICD-10-CM | POA: Insufficient documentation

## 2024-01-11 DIAGNOSIS — N200 Calculus of kidney: Secondary | ICD-10-CM | POA: Diagnosis not present

## 2024-01-11 LAB — URINALYSIS, ROUTINE W REFLEX MICROSCOPIC
Bacteria, UA: NONE SEEN
Bilirubin Urine: NEGATIVE
Glucose, UA: NEGATIVE mg/dL
Hgb urine dipstick: NEGATIVE
Ketones, ur: NEGATIVE mg/dL
Leukocytes,Ua: NEGATIVE
Nitrite: NEGATIVE
Protein, ur: NEGATIVE mg/dL
Specific Gravity, Urine: 1.018 (ref 1.005–1.030)
pH: 7.5 (ref 5.0–8.0)

## 2024-01-11 LAB — CBC
HCT: 36.1 % (ref 36.0–46.0)
Hemoglobin: 11.5 g/dL — ABNORMAL LOW (ref 12.0–15.0)
MCH: 29 pg (ref 26.0–34.0)
MCHC: 31.9 g/dL (ref 30.0–36.0)
MCV: 90.9 fL (ref 80.0–100.0)
Platelets: 289 K/uL (ref 150–400)
RBC: 3.97 MIL/uL (ref 3.87–5.11)
RDW: 14.6 % (ref 11.5–15.5)
WBC: 9 K/uL (ref 4.0–10.5)
nRBC: 0 % (ref 0.0–0.2)

## 2024-01-11 LAB — BASIC METABOLIC PANEL WITH GFR
Anion gap: 7 (ref 5–15)
BUN: 10 mg/dL (ref 6–20)
CO2: 26 mmol/L (ref 22–32)
Calcium: 9.2 mg/dL (ref 8.9–10.3)
Chloride: 105 mmol/L (ref 98–111)
Creatinine, Ser: 0.66 mg/dL (ref 0.44–1.00)
GFR, Estimated: >60 mL/min (ref >60)
Glucose, Bld: 112 mg/dL — ABNORMAL HIGH (ref 70–99)
Potassium: 4.4 mmol/L (ref 3.5–5.1)
Sodium: 138 mmol/L (ref 135–145)

## 2024-01-11 LAB — HEPATIC FUNCTION PANEL
ALT: 9 U/L (ref 0–44)
AST: 13 U/L — ABNORMAL LOW (ref 15–41)
Albumin: 3.8 g/dL (ref 3.5–5.0)
Alkaline Phosphatase: 74 U/L (ref 38–126)
Bilirubin, Direct: <0.1 mg/dL (ref 0.0–0.2)
Total Bilirubin: 0.2 mg/dL (ref 0.0–1.2)
Total Protein: 6.6 g/dL (ref 6.5–8.1)

## 2024-01-11 LAB — PREGNANCY, URINE: Preg Test, Ur: NEGATIVE

## 2024-01-11 MED ORDER — ONDANSETRON 4 MG PO TBDP: 4.0000 mg | ORAL_TABLET | Freq: Three times a day (TID) | ORAL | 0 refills | Status: AC | PRN

## 2024-01-11 MED ORDER — ONDANSETRON HCL 4 MG/2ML IJ SOLN
4.0000 mg | Freq: Once | INTRAMUSCULAR | Status: AC
  Administered 2024-01-11: 4 mg via INTRAVENOUS
  Filled 2024-01-11: qty 2

## 2024-01-11 MED ORDER — OXYCODONE-ACETAMINOPHEN 5-325 MG PO TABS: 1.0000 | ORAL_TABLET | ORAL | 0 refills | Status: AC | PRN

## 2024-01-11 MED ORDER — MAGNESIUM SULFATE 2 GM/50ML IV SOLN
2.0000 g | Freq: Once | INTRAVENOUS | Status: AC
  Administered 2024-01-11: 2 g via INTRAVENOUS
  Filled 2024-01-11: qty 50

## 2024-01-11 MED ORDER — MORPHINE SULFATE (PF) 4 MG/ML IV SOLN
4.0000 mg | Freq: Once | INTRAVENOUS | Status: AC
  Administered 2024-01-11: 4 mg via INTRAVENOUS
  Filled 2024-01-11: qty 1

## 2024-01-11 MED ORDER — SODIUM CHLORIDE 0.9 % IV BOLUS
1000.0000 mL | Freq: Once | INTRAVENOUS | Status: AC
  Administered 2024-01-11: 1000 mL via INTRAVENOUS

## 2024-01-11 NOTE — ED Triage Notes (Signed)
 Pt reports right sided kidney stones X 5 days. Endorse dysuria & nausea.   Reports hx of kidney stones

## 2024-01-11 NOTE — ED Notes (Signed)
 DC paperwork given and verbally understood.

## 2024-01-11 NOTE — Discharge Instructions (Signed)
 Your history, exam, and evaluation today seem consistent with a recently passed kidney stone causing your pain that felt similar to previous kidney stones.  The CT scan did not show ureteral or obstructing stone but I do suspect you may have recently passed 1 given your symptoms.  Your urine did not show evidence of infection and the rest of your labs were also reassuring.  We feel you are safe for discharge home to get some symptomatic relief medication and follow-up with your PCP and urology teams.  If any symptoms change or worsen acutely, please return to the nearest emergency department.

## 2024-01-11 NOTE — ED Provider Notes (Signed)
 [place] EMERGENCY DEPARTMENT AT [facility] Provider Note   CSN: [id number] Arrival date & time: 01/11/24  9078     Patient presents with: Flank Pain   Janice Fisher is a 28 y.o. female.   The history is provided by the patient and medical records. No language interpreter was used.  Flank Pain This is a recurrent problem. The current episode started more than 2 days ago. The problem occurs constantly. The problem has not changed since onset.Pertinent negatives include no chest pain, no abdominal pain (r flank), no headaches and no shortness of breath. Nothing aggravates the symptoms. Nothing relieves the symptoms. She has tried nothing for the symptoms. The treatment provided no relief.       Prior to Admission medications   Medication Sig Start Date End Date Taking? Authorizing Provider  acetaminophen  (TYLENOL ) 500 MG tablet Take 1 tablet (500 mg total) by mouth every 6 (six) hours as needed. 04/10/23   Nivia Colon, PA-C  benzonatate  (TESSALON ) 100 MG capsule Take 1 capsule (100 mg total) by mouth every 8 (eight) hours. 04/10/23   Nivia Colon, PA-C  famotidine  (PEPCID ) 20 MG tablet Take 1 tablet (20 mg total) by mouth 2 (two) times daily. 10/20/21   Trine Raynell Moder, MD  hydrOXYzine (ATARAX) 25 MG tablet Take 25 mg by mouth 3 (three) times daily as needed.    [provider]  naproxen  (NAPROSYN ) 500 MG tablet Take 1 tablet (500 mg total) by mouth 2 (two) times daily as needed (knee pain). 04/23/23   Teressa Clock C, DO  ondansetron  (ZOFRAN -ODT) 4 MG disintegrating tablet Take 1 tablet (4 mg total) by mouth every 8 (eight) hours as needed for nausea or vomiting. 03/22/22   Desiderio Chew, PA-C  oseltamivir  (TAMIFLU ) 75 MG capsule Take 1 capsule (75 mg total) by mouth every 12 (twelve) hours. 04/10/23   Nivia Colon, PA-C  oxyCODONE  (ROXICODONE ) 5 MG immediate release tablet Take 1 tablet (5 mg total) by mouth every 4 (four) hours as needed for severe pain. 11/04/22    Yolande Lamar BROCKS, MD  oxyCODONE -acetaminophen  (PERCOCET) 5-325 MG tablet Take 1-2 tablets by mouth every 6 (six) hours as needed. 07/27/23   Geroldine Berg, MD    Allergies: Patient has no known allergies.    Review of Systems  Constitutional:  Negative for chills, fatigue and fever.  HENT:  Negative for congestion.   Respiratory:  Negative for shortness of breath.   Cardiovascular:  Negative for chest pain and palpitations.  Gastrointestinal:  Positive for nausea. Negative for abdominal distention, abdominal pain (r flank), constipation, diarrhea and vomiting.  Genitourinary:  Positive for flank pain. Negative for dysuria and frequency.  Musculoskeletal:  Positive for back pain. Negative for neck pain and neck stiffness.  Skin:  Negative for rash and wound.  Neurological:  Negative for weakness, light-headedness, numbness and headaches.  Psychiatric/Behavioral:  Negative for agitation.     Updated Vital Signs BP (!) 140/91 (BP Location: Right Arm)   Pulse 70   Temp 98.3 F (36.8 C) (Oral)   Resp 18   SpO2 98%   Physical Exam Vitals and nursing note reviewed.  Constitutional:      General: She is not in acute distress.    Appearance: She is well-developed. She is not ill-appearing, toxic-appearing or diaphoretic.  HENT:     Head: Normocephalic and atraumatic.     Nose: No congestion or rhinorrhea.     Mouth/Throat:     Mouth: Mucous membranes are dry.  Pharynx: No oropharyngeal exudate or posterior oropharyngeal erythema.  Eyes:     Extraocular Movements: Extraocular movements intact.     Conjunctiva/sclera: Conjunctivae normal.     Pupils: Pupils are equal, round, and reactive to light.  Cardiovascular:     Rate and Rhythm: Normal rate and regular rhythm.     Heart sounds: No murmur heard. Pulmonary:     Effort: Pulmonary effort is normal. No respiratory distress.     Breath sounds: Normal breath sounds. No wheezing, rhonchi or rales.  Chest:     Chest wall: No  tenderness.  Abdominal:     Palpations: Abdomen is soft.     Tenderness: There is no abdominal tenderness. There is right CVA tenderness. There is no left CVA tenderness, guarding or rebound.  Musculoskeletal:        General: Tenderness present. No swelling.     Cervical back: Neck supple. No tenderness.  Skin:    General: Skin is warm and dry.     Capillary Refill: Capillary refill takes less than 2 seconds.     Findings: No erythema or rash.  Neurological:     General: No focal deficit present.     Mental Status: She is alert.  Psychiatric:        Mood and Affect: Mood normal.     (all labs ordered are listed, but only abnormal results are displayed) Labs Reviewed  URINALYSIS, ROUTINE W REFLEX MICROSCOPIC - Abnormal; Notable for the following components:      Result Value   APPearance HAZY (*)    All other components within normal limits  BASIC METABOLIC PANEL WITH GFR - Abnormal; Notable for the following components:   Glucose, Bld 112 (*)    All other components within normal limits  CBC - Abnormal; Notable for the following components:   Hemoglobin 11.5 (*)    All other components within normal limits  HEPATIC FUNCTION PANEL - Abnormal; Notable for the following components:   AST 13 (*)    All other components within normal limits  PREGNANCY, URINE    EKG: None  Radiology: CT Renal Stone Study Result Date: 01/11/2024 CLINICAL DATA:  Right flank pain 5 days.  Dysuria and nausea. EXAM: CT ABDOMEN AND PELVIS WITHOUT CONTRAST TECHNIQUE: Multidetector CT imaging of the abdomen and pelvis was performed following the standard protocol without IV contrast. RADIATION DOSE REDUCTION: This exam was performed according to the departmental dose-optimization program which includes automated exposure control, adjustment of the mA and/or kV according to patient size and/or use of iterative reconstruction technique. COMPARISON:  11/25/2023 FINDINGS: Lower chest: Heart is normal size.   Visualized lung bases are clear. Hepatobiliary: Liver, gallbladder and biliary tree are normal. Pancreas: Normal. Spleen: Normal. Adrenals/Urinary Tract: Adrenal glands are normal pre kidneys are normal size without hydronephrosis or focal mass. Multiple small bilateral renal stones are present. No perinephric inflammation or fluid. Ureters and bladder are normal. Stomach/Bowel: Stomach and small bowel are normal. Appendix is normal. Redundant sigmoid colon as the colon is otherwise unremarkable. Vascular/Lymphatic: Abdominal aorta is normal in caliber. Remaining vascular structures are unremarkable. No adenopathy. Reproductive: Uterus and bilateral adnexa are unremarkable. Other: No free fluid or focal inflammatory change. Musculoskeletal: No focal abnormality. IMPRESSION: 1. No acute findings in the abdomen/pelvis. 2. Bilateral nephrolithiasis.  No ureteral stones or obstruction. Electronically Signed   By: Toribio Agreste M.D.   On: 01/11/2024 10:54     Procedures   Medications Ordered in the ED  morphine  (PF)  4 MG/ML injection 4 mg (4 mg Intravenous Given 01/11/24 1041)  ondansetron  (ZOFRAN ) injection 4 mg (4 mg Intravenous Given 01/11/24 1040)  sodium chloride  0.9 % bolus 1,000 mL (0 mLs Intravenous Stopped 01/11/24 1219)  magnesium sulfate IVPB 2 g 50 mL (0 g Intravenous Stopped 01/11/24 1219)  morphine  (PF) 4 MG/ML injection 4 mg (4 mg Intravenous Given 01/11/24 1338)                                    Medical Decision Making Amount and/or Complexity of Data Reviewed Labs: ordered. Radiology: ordered.  Risk Prescription drug management.    Janice Fisher is a 28 y.o. female with a past medical history significant for previous kidney stones who presents with right flank pain and nausea.  According to patient, for the last week or so she has been having pain in her right back and right flank concerning for recurrent kidney stone.  She has some nausea but denies significant vomiting.  For  me she denies dysuria or hematuria but has some frequency.  Denies anterior abdominal pain.  Denies constipation, diarrhea, and denies any fevers.  She has had some mild chills.  She denies any chest pain shortness of breath or cough.  Denies trauma.  Denies rash to suggest shingles.  Denies symptoms in extremities at this time.  On exam, lungs clear.  Chest nontender.  Abdomen nontender.  She does have tenderness in her right CVA and right flank area.  No rash to suggest shingles.  Intact pulses in extremities.  No focal neurologic deficits.  Chest and abdomen otherwise nontender.  Lungs clear.  Patient otherwise well-appearing.  Patient does feel she is getting dehydrated with her decreased oral intake and nausea.  Will give some IV fluids and keep her n.p.o. in case she needs recurrent intervention for her possible stone.  Will get a stone study and labs and urinalysis.  Will give her some pain medicine, nausea medicine.  Anticipate reassessment after workup to determine disposition.  2:42 PM Workup returned reassuring.  Patient's anemia similar to prior and CBC and CMP otherwise reassuring.  Her urinalysis does not show evidence of urinary tract infection she is not pregnant.  Her CT scan does not show large obstructing stone but she does have bilateral stones in her kidneys.  Given her history of similar symptoms and discomfort I suspect she may have recently passed a stone causing her symptoms.  She still having some pains on and off but otherwise well-appearing.  The medications seem to help.  As I suspect she is having some post passage of kidney stone pain will give prescription for pain medicine and nausea medicine and let her follow-up with her urology team and PCP.  Patient agrees with this.  We also discussed the possibly of shingles but she has no history of getting chickenpox and there was no rash.  Patient will follow-up with her outpatient teams and will be discharged for outpatient  follow-up.      Final diagnoses:  Right flank pain  Bilateral kidney stones    ED Discharge Orders          Ordered    oxyCODONE -acetaminophen  (PERCOCET/ROXICET) 5-325 MG tablet  Every 4 hours PRN        01/11/24 1444    ondansetron  (ZOFRAN -ODT) 4 MG disintegrating tablet  Every 8 hours PRN        01/11/24 1444  Clinical Impression: 1. Right flank pain   2. Bilateral kidney stones     Disposition: Discharge  Condition: Good  I have discussed the results, Dx and Tx plan with the pt(& family if present). He/she/they expressed understanding and agree(s) with the plan. Discharge instructions discussed at great length. Strict return precautions discussed and pt &/or family have verbalized understanding of the instructions. No further questions at time of discharge.    New Prescriptions   ONDANSETRON  (ZOFRAN -ODT) 4 MG DISINTEGRATING TABLET    Take 1 tablet (4 mg total) by mouth every 8 (eight) hours as needed for nausea or vomiting.   OXYCODONE -ACETAMINOPHEN  (PERCOCET/ROXICET) 5-325 MG TABLET    Take 1 tablet by mouth every 4 (four) hours as needed for severe pain (pain score 7-10).    Follow Up: Katina Pfeiffer, PA-C [address] [place] [address] [phone]     [facility] Emergency Department at [facility] [address] [place]  [address] [phone]    [facility] [address] [place] [address] [place]  [address] [phone]        Tiwana Chavis, Lonni PARAS, MD 01/11/24 1447
